# Patient Record
Sex: Female | Born: 1955 | Race: White | Hispanic: No | Smoking: Former smoker
Health system: Southern US, Community
[De-identification: ages and names within clinical notes are randomized; demographics above are authoritative.]

## PROBLEM LIST (undated history)

## (undated) DIAGNOSIS — E7800 Pure hypercholesterolemia, unspecified: Secondary | ICD-10-CM

## (undated) DIAGNOSIS — G4700 Insomnia, unspecified: Secondary | ICD-10-CM

## (undated) DIAGNOSIS — Z950 Presence of cardiac pacemaker: Secondary | ICD-10-CM

## (undated) DIAGNOSIS — K439 Ventral hernia without obstruction or gangrene: Secondary | ICD-10-CM

## (undated) DIAGNOSIS — R001 Bradycardia, unspecified: Secondary | ICD-10-CM

## (undated) DIAGNOSIS — D696 Thrombocytopenia, unspecified: Secondary | ICD-10-CM

## (undated) DIAGNOSIS — C20 Malignant neoplasm of rectum: Secondary | ICD-10-CM

## (undated) HISTORY — PX: ABDOMINAL HYSTERECTOMY: SHX81

## (undated) HISTORY — PX: CEREBRAL ANEURYSM REPAIR: SHX164

## (undated) HISTORY — PX: COLON SURGERY: SHX602

## (undated) HISTORY — PX: VENTRAL HERNIA REPAIR: SHX424

## (undated) HISTORY — DX: Ventral hernia without obstruction or gangrene: K43.9

## (undated) HISTORY — DX: Thrombocytopenia, unspecified: D69.6

## (undated) HISTORY — PX: CHOLECYSTECTOMY: SHX55

## (undated) HISTORY — DX: Insomnia, unspecified: G47.00

## (undated) HISTORY — DX: Malignant neoplasm of rectum: C20

---

## 1997-09-30 ENCOUNTER — Emergency Department (HOSPITAL_COMMUNITY): Admission: EM | Admit: 1997-09-30 | Discharge: 1997-09-30 | Payer: Self-pay | Admitting: Emergency Medicine

## 1998-01-18 ENCOUNTER — Encounter: Payer: Self-pay | Admitting: Emergency Medicine

## 1998-01-18 ENCOUNTER — Emergency Department (HOSPITAL_COMMUNITY): Admission: EM | Admit: 1998-01-18 | Discharge: 1998-01-18 | Payer: Self-pay | Admitting: Emergency Medicine

## 1999-04-06 ENCOUNTER — Inpatient Hospital Stay (HOSPITAL_COMMUNITY): Admission: EM | Admit: 1999-04-06 | Discharge: 1999-04-08 | Payer: Self-pay | Admitting: Emergency Medicine

## 1999-12-27 ENCOUNTER — Emergency Department (HOSPITAL_COMMUNITY): Admission: EM | Admit: 1999-12-27 | Discharge: 1999-12-27 | Payer: Self-pay | Admitting: Emergency Medicine

## 1999-12-28 ENCOUNTER — Encounter: Payer: Self-pay | Admitting: Emergency Medicine

## 2001-01-30 ENCOUNTER — Ambulatory Visit (HOSPITAL_COMMUNITY)
Admission: RE | Admit: 2001-01-30 | Discharge: 2001-01-30 | Payer: Self-pay | Admitting: Physical Medicine and Rehabilitation

## 2001-01-30 ENCOUNTER — Encounter: Payer: Self-pay | Admitting: Physical Medicine and Rehabilitation

## 2001-04-14 ENCOUNTER — Encounter: Payer: Self-pay | Admitting: Neurosurgery

## 2001-04-14 ENCOUNTER — Ambulatory Visit (HOSPITAL_COMMUNITY): Admission: RE | Admit: 2001-04-14 | Discharge: 2001-04-14 | Payer: Self-pay | Admitting: Neurosurgery

## 2001-04-24 ENCOUNTER — Ambulatory Visit (HOSPITAL_COMMUNITY): Admission: RE | Admit: 2001-04-24 | Discharge: 2001-04-24 | Payer: Self-pay | Admitting: Neurosurgery

## 2001-04-24 ENCOUNTER — Encounter: Payer: Self-pay | Admitting: Neurosurgery

## 2002-04-23 ENCOUNTER — Encounter: Payer: Self-pay | Admitting: Internal Medicine

## 2002-04-23 ENCOUNTER — Ambulatory Visit (HOSPITAL_COMMUNITY): Admission: RE | Admit: 2002-04-23 | Discharge: 2002-04-23 | Payer: Self-pay | Admitting: Internal Medicine

## 2002-11-15 ENCOUNTER — Emergency Department (HOSPITAL_COMMUNITY): Admission: EM | Admit: 2002-11-15 | Discharge: 2002-11-16 | Payer: Self-pay | Admitting: Emergency Medicine

## 2004-12-06 ENCOUNTER — Ambulatory Visit (HOSPITAL_COMMUNITY): Admission: RE | Admit: 2004-12-06 | Discharge: 2004-12-06 | Payer: Self-pay | Admitting: Specialist

## 2005-12-17 ENCOUNTER — Ambulatory Visit (HOSPITAL_COMMUNITY): Admission: RE | Admit: 2005-12-17 | Discharge: 2005-12-17 | Payer: Self-pay | Admitting: Family Medicine

## 2005-12-24 ENCOUNTER — Ambulatory Visit (HOSPITAL_COMMUNITY): Admission: RE | Admit: 2005-12-24 | Discharge: 2005-12-24 | Payer: Self-pay

## 2006-06-03 DIAGNOSIS — C20 Malignant neoplasm of rectum: Secondary | ICD-10-CM

## 2006-06-03 HISTORY — DX: Malignant neoplasm of rectum: C20

## 2006-12-24 ENCOUNTER — Ambulatory Visit (HOSPITAL_COMMUNITY): Admission: RE | Admit: 2006-12-24 | Discharge: 2006-12-24 | Payer: Self-pay | Admitting: Gastroenterology

## 2006-12-24 ENCOUNTER — Encounter: Payer: Self-pay | Admitting: Gastroenterology

## 2006-12-24 ENCOUNTER — Ambulatory Visit: Payer: Self-pay | Admitting: Gastroenterology

## 2006-12-24 HISTORY — PX: SIGMOIDOSCOPY: SUR1295

## 2006-12-25 ENCOUNTER — Ambulatory Visit (HOSPITAL_COMMUNITY): Admission: RE | Admit: 2006-12-25 | Discharge: 2006-12-25 | Payer: Self-pay | Admitting: Gastroenterology

## 2007-02-04 ENCOUNTER — Ambulatory Visit: Admission: RE | Admit: 2007-02-04 | Discharge: 2007-04-09 | Payer: Self-pay | Admitting: Radiation Oncology

## 2007-11-13 ENCOUNTER — Ambulatory Visit (HOSPITAL_COMMUNITY): Admission: RE | Admit: 2007-11-13 | Discharge: 2007-11-13 | Payer: Self-pay | Admitting: Gastroenterology

## 2007-11-13 ENCOUNTER — Ambulatory Visit: Payer: Self-pay | Admitting: Gastroenterology

## 2007-11-13 HISTORY — PX: COLONOSCOPY: SHX174

## 2008-09-07 ENCOUNTER — Ambulatory Visit: Payer: Self-pay | Admitting: Gastroenterology

## 2008-09-07 DIAGNOSIS — C19 Malignant neoplasm of rectosigmoid junction: Secondary | ICD-10-CM | POA: Insufficient documentation

## 2008-09-12 ENCOUNTER — Ambulatory Visit: Payer: Self-pay | Admitting: Gastroenterology

## 2008-09-12 HISTORY — PX: COLONOSCOPY: SHX174

## 2008-09-15 ENCOUNTER — Ambulatory Visit (HOSPITAL_COMMUNITY): Admission: RE | Admit: 2008-09-15 | Discharge: 2008-09-15 | Payer: Self-pay | Admitting: Gastroenterology

## 2008-12-28 ENCOUNTER — Encounter: Payer: Self-pay | Admitting: Gastroenterology

## 2009-08-18 ENCOUNTER — Encounter (INDEPENDENT_AMBULATORY_CARE_PROVIDER_SITE_OTHER): Payer: Self-pay | Admitting: *Deleted

## 2009-09-11 DIAGNOSIS — C20 Malignant neoplasm of rectum: Secondary | ICD-10-CM | POA: Insufficient documentation

## 2009-09-13 ENCOUNTER — Ambulatory Visit: Payer: Self-pay | Admitting: Gastroenterology

## 2009-11-15 ENCOUNTER — Encounter: Payer: Self-pay | Admitting: Gastroenterology

## 2010-05-30 ENCOUNTER — Encounter: Payer: Self-pay | Admitting: Gastroenterology

## 2010-07-05 NOTE — Letter (Signed)
Summary: [facility] CLINIC NOTE FROM DR [facility]  [facility] CLINIC NOTE FROM DR SHEN   Imported By: Rexene Alberts 05/30/2010 11:49:27  _____________________________________________________________________  External Attachment:    Type:   Image     Comment:   External Document

## 2010-07-05 NOTE — Letter (Signed)
Summary: External Other  External Other   Imported By: Peggyann Shoals 11/15/2009 14:06:59  _____________________________________________________________________  External Attachment:    Type:   Image     Comment:   External Document

## 2010-07-05 NOTE — Letter (Signed)
Summary: [facility]  [facility]  [address]   [place], [place] [address]   Phone: [phone]  Fax: [phone]    08/18/2009  Robin Wolfe [address] [place], [place]  [address] [birth year]  Dear [name],   Your physician has indicated that:   _______it is time to schedule an appointment.   _______you missed your appointment on______ and need to call and  reschedule.   _______you need to have lab work done.   _______you need to schedule an appointment to discuss lab or test results.   _______you need to call to reschedule your appointment that was scheduled on _________.   Please call our office at  [phone].    Thank you,    [facility] Ph: [phone]   Fax: [phone]

## 2010-07-05 NOTE — Assessment & Plan Note (Signed)
Summary: STAGE III RECTAL CA   Visit Type:  Follow-up Visit Primary Care Provider:  Sherwood Gambler, M.D.  Chief Complaint:  1 year follow up- doing ok.  History of Present Illness: No q/c/c. Energy level: good. Appetite: good. Sees Dr. Ubaldo Glassing q6 ZOX:WRUE/AVWUJ CA. Hernia fixed and next follwo Feb 2012. BMs: good-depends on how much she eats. Weight: fluctuates 128-132 lbs. Uses Ibuprofen for back pain and Xanax for sleep.  Current Medications (verified): 1)  Soma 350 Mg Tabs (Carisoprodol) .... Two Times A Day 2)  Alprazolam 0.5 Mg Tbdp (Alprazolam) .Marland Kitchen.. 1-2 Once Daily 3)  Ibuprofen 200 Mg Tabs (Ibuprofen) .... As Needed  Allergies (verified): 1)  ! Compazine 2)  ! 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 Deveron Furlong HYSTERECTOMY SECONDARY TO ENDOMETRIOSIS CHOLECYSTECOMY  Vital Signs:  Patient profile:   55 year old female Height:      64 inches Weight:      133 pounds BMI:     22.91 Temp:     98.2 degrees F oral Pulse rate:   64 / minute BP sitting:   128 / 90  (left arm) Cuff size:   regular  Vitals Entered By: Hendricks Limes LPN (September 13, 2009 3:31 PM)  Physical Exam  General:  Well developed, well nourished, no acute distress. Head:  Normocephalic and atraumatic. Lungs:  Clear throughout to auscultation. Heart:  Regular rate and rhythm; no murmurs. Abdomen:  Soft, nontender and nondistended. Normal bowel sounds.  Impression & Recommendations:  Problem # 1:  RECTAL CANCER (ICD-154.1) Assessment Unchanged TCS 2013. High fiber diet. OPV as needed.  CC: PCP  Other Orders: Est. Patient Level II (81191)  Appended Document: STAGE III RECTAL CA reminder appt made for TCS in 2013- cdg

## 2010-07-31 ENCOUNTER — Encounter: Payer: Self-pay | Admitting: Internal Medicine

## 2010-09-12 LAB — CBC
MCHC: 35.2 g/dL (ref 30.0–36.0)
Platelets: 101 10*3/uL — ABNORMAL LOW (ref 150–400)
RBC: 3.59 MIL/uL — ABNORMAL LOW (ref 3.87–5.11)
RDW: 14.4 % (ref 11.5–15.5)

## 2010-09-12 LAB — BASIC METABOLIC PANEL
BUN: 12 mg/dL (ref 6–23)
CO2: 33 mEq/L — ABNORMAL HIGH (ref 19–32)
Calcium: 9.3 mg/dL (ref 8.4–10.5)
Creatinine, Ser: 0.71 mg/dL (ref 0.4–1.2)
GFR calc Af Amer: >60 mL/min (ref >60)
Glucose, Bld: 96 mg/dL (ref 70–99)

## 2010-10-16 NOTE — Op Note (Signed)
[name], [name]                 ACCOUNT NO.:  [id number]   MEDICAL RECORD NO.:  [id number]          PATIENT TYPE:  AMB   LOCATION:  DAY                           FACILITY:  [facility]   PHYSICIAN:  Kassie Mends, M.D.      DATE OF BIRTH:  10/07/1955   DATE OF PROCEDURE:  DATE OF DISCHARGE:                               OPERATIVE REPORT   PRIMARY CARE PHYSICIAN:  Elfredia Nevins, MD.   REFERRING PHYSICIAN:  Dr. Flonnie Hailstone, [facility].   PROCEDURE:  Colonoscopy.   INDICATION FOR EXAM:  Robin Wolfe is a 55 year old female who presented in  July of 2008 with rectal bleeding.  She had an incomplete colonoscopy  due to a mass approximately 5 cm from the anal verge that was partially  obstructing the lumen.  She subsequently had a resection with a  diverting ileostomy.  She presents today for evaluation to clear her  colon and has a pending appointment with Robin Wolfe on November 19, 2007 to  discuss takedown of her ostomy and secondary anastomosis.   FINDINGS:  1. The anastomosis was located approximately 5 cm from the anal verge.      The adult colonoscope would not pass through the anastomotic site.      No polyps or lesions were seen at the anastomotic site.  Sutures      were see seen.  The adult colonoscope was exchanged for the      diagnostic gastroscope, which passed through the anastomosis with      moderate resistance.  The diagnostic gastroscope was passed to the      proximal portion of the colon.  A moderate amount of stool was seen      in the proximal colon and was unable to appreciate an ileocecal      valve or an appendiceal orifice.  The op note from her surgery at      [facility] was not available to me.  No polyps, masses,      diverticula or AVMs were seen.  With distention of the colon the      mucosa became somewhat hemorrhagic but no tears were seen and no      active bleeding was noted.  She had a normal retroflexed view of      the rectum.   DIAGNOSIS:  1.  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  2. Normal visualized colon with stool seen in the proximal colon.   RECOMMENDATIONS:  1. Recommend Robin Wolfe see Dr. Flonnie Hailstone and decide whether or not they      would like to repeat a colonoscopy with dilation of the anastomosis      so that a pediatric or an adult colonoscope could be advanced into      the proximal colon.  She was given pictures to take with to her to      visit with Robin Wolfe.  I will speak  with Robin Wolfe in regard to      these findings.  Her next endoscopy should be done with propofol      because she required high doses of Demerol, Versed and Phenergan to      be adequately sedated.   MEDICATIONS:  1. Demerol 150 mg IV.  2. Versed 10 mg IV.  3. Phenergan 25 mg IV.   PROCEDURE TECHNIQUE:  Physical exam was performed.  Informed consent was  obtained from the patient after explaining the benefits, risks and  alternatives to the procedure.  The patient was connected to the monitor  and placed in the left lateral position.  Continuous oxygen was provided  by nasal cannula, IV medicine administered through an indwelling  cannula.  After administration of sedation and rectal exam, the  patient's rectum was intubated and the adult colonoscope was advanced to  the anastomosis.  Due to the narrowing of the lumen, the adult  colonoscope was exchanged for a diagnostic gastroscope.  The diagnostic  gastroscope was passed into the proximal colon.  The scope was withdrawn  slowly by carefully examining the color, texture, anatomy and integrity  of the mucosa on the way out.  The patient was recovered in endoscopy  and discharged home in satisfactory condition.   PATH:  Spoke with Dr. Flonnie Hailstone. Recommended repeat colonoscopy at [facility]  with dilation of the anastamosis and confirmation of complete  colonoscopy  followed by ostomy takedown.      Kassie Mends, M.D.  Electronically Signed     SM/MEDQ  D:  11/13/2007  T:  11/13/2007  Job:  [id number]   cc:   Robin Rear. Sherwood Gambler, MD  Fax: [phone]   Sherrilyn Rist, MD  [facility]

## 2010-10-16 NOTE — Op Note (Signed)
[name], [name]                 ACCOUNT NO.:  [id number]   MEDICAL RECORD NO.:  [id number]         PATIENT TYPE:  PAMB   LOCATION:  DAY                           FACILITY:  [facility]   PHYSICIAN:  Robin Wolfe, M.D.      DATE OF BIRTH:  1955-10-10   DATE OF PROCEDURE:  09/12/2008  DATE OF DISCHARGE:                               OPERATIVE REPORT   REFERRING Robin Wolfe:  Robin Rear. Sherwood Gambler, MD   PROCEDURE:  Colonoscopy.   INDICATION FOR EXAM:  Robin Wolfe is a 55 year old female who was  diagnosed with colon cancer in 2008.  She had a subsequent resection and  chemoradiation.  She initially had an ileostomy and then a take down.  She has not had a complete colonoscopy since being diagnosed.   FINDINGS:  1. Probable anastomosis.  Otherwise, no mass or polyps seen.  No      inflammatory changes, polyps, or AVMs seen throughout the colon.      No diverticula.  2. Small rectal pouch, which made retroflexion challenging.  Otherwise      no polyps detected in the rectum.   DIAGNOSIS:  Friable anastomosis.   RECOMMENDATIONS:  1. Screening colonoscopy in 3 years.  2. She should follow up with Dr. Emelda Wolfe in regards to her complaint      for vaginal stenosis.  She may also consult with her radiation      oncologist.  3. She was noted to have pancytopenia.  She should follow up with her      oncologist in regards to a repeat CBC.  4. She should follow a high-fiber diet.  She was given a handout on      high-fiber diet.  All first-degree relatives should have a      screening colonoscopy at age 65 and then every 5 years.   MEDICATIONS:  Propofol provided by An[facility].   PROCEDURE TECHNIQUE:  Physical exam was performed.  Informed consent was  obtained from the patient after explaining the benefits, risks, and  alternatives to the procedure.  The patient was connected to the monitor  and placed in a left lateral position.  Continuous oxygen was provided  by nasal cannula and IV medicine  administered through an indwelling  cannula.  After administration of sedation and rectal exam, the  patient's rectum was intubated and the scope was advanced under direct  visualization to the distal terminal ileum.  The scope was removed  slowly  by carefully examining the color, texture, anatomy, and integrity of the  mucosa on the way out.  Retroflexed view was challenging in the small  rectal pouch.  The patient was recovered in endoscopy and discharged  home in satisfactory condition.      Robin Wolfe, M.D.  Electronically Signed     SM/MEDQ  D:  09/15/2008  T:  09/16/2008  Job:  [id number]   cc:   Robin Rear. Sherwood Gambler, MD  Fax: [phone]

## 2010-10-16 NOTE — Op Note (Signed)
Robin Wolfe, Robin Wolfe                 ACCOUNT NO.:  [id number]   MEDICAL RECORD NO.:  [id number]          PATIENT TYPE:  AMB   LOCATION:  DAY                           FACILITY:  [facility]   PHYSICIAN:  Kassie Mends, M.D.      DATE OF BIRTH:  [birth year]   DATE OF PROCEDURE:  12/24/2006  DATE OF DISCHARGE:                               OPERATIVE REPORT   PROCEDURE:  Sigmoidoscopy with cold forceps biopsy.   INDICATION FOR EXAM:  Robin Wolfe is a 55 year old patient who was  referred for screening colonoscopy.  She reports having rectal bleeding  and change in bowel habits in February 2008.   FINDINGS:  1. Unable to pass the adult colonoscope beyond a rectal mass located      approximately 5 cm from anal verge.  The adult colonoscope was      exchanged for the diagnostic gastroscope and passed approximately      40 cm beyond anal verge.  A large ulcerated mass extends from 5 cm      beyond anal verge to 12 cm beyond anal verge.  Biopsies were      obtained via cold forceps.  2. Normal retroflexed view of the rectum.   RECOMMENDATIONS:  1. Low residue diet.  She is given handout on low residue diet.  2. She will be scheduled for a chest x-ray, PA and lateral on CT scan      the abdomen and pelvis with and without IV contrast and oral      contrast on 12/25/06.  3. Will draw CEA, PT/PTT, CBC and complete metabolic panel today.  4. She should have colonoscopy in 6 months to a year to complete      evaluation of her colon.  5. No aspirin or anti-inflammatory drugs or anticoagulation.  6. The patient is scheduled for surgical evaluation with Dr.  Deveron Furlong on January 06, 2007, 8:45 a.m.  She is given pictures from the      sigmoidoscopy on today.  The pathology slides also are being sent      to Dr. Deveron Furlong after they had been read.  His contact phone      number is [phone], and fax number is [phone].   MEDICATIONS:  1. Demerol 75 mg IV.  2. Versed 4 mg IV.   PROCEDURE  TECHNIQUE:  Physical exam was performed.  Informed consent was  obtained from the patient after explaining benefits, risks and  alternatives to the procedure.  The patient connected to monitor and  placed in the left lateral position.  Continuous oxygen was provided by  nasal cannula and IV medicine administered through an indwelling  cannula.  After administration of sedation and rectal exam the scope was  advanced under direct visualization to 40 cm beyond anal verge.  The  scope was removed slowly by carefully examining the color, texture,  anatomy and integrity mucosa on the way out.  The patient was recovered  in endoscopy and discharged home in satisfactory condition.   ADDENDUM: Called by Dr. Sherryll Burger.  Patient has rectal adenocarcinoma.      Kassie Mends, M.D.  Electronically Signed     SM/MEDQ  D:  12/24/2006  T:  12/24/2006  Job:  [id number]   cc:   Madelin Rear. Sherwood Gambler, MD  Fax: [phone]

## 2010-10-19 NOTE — Procedures (Signed)
PURPOSE OF EVALUATION:  Independent medical evaluation requested by [employer].   CLAIM NUMBER:  Social security number [id number]   DATE OF BIRTH:  03/23/1956.   OCCUPATION:  LPN   DATE OF DISABILITY:  January 16, 2001.   HISTORY OF PRESENT ILLNESS:  Ms. Rattan is a 55 year old adult female  requested to be seen in this office for an independent medical evaluation by  [facility].   Her medical records were reviewed prior to the patient's appointment and  then those were again reviewed with her in the office today.  Summary of the  history is presented  chronologically as noted below.   The patient reports low back pain dating to at least 52.  She reports that  was treated by a chiropractor, Dr. Raynelle Bring.   Medical records indicate that December 06, 1996 an MRI scan of her lumbar spine  showed no significant abnormality.   December 10, 1996 the patient was seen by Dr. Lovell Sheehan a local neurosurgeon.  The  diagnosis was lumbago with occasional radiculopathy.  He noted studies were  normal.  No surgery was planned.  There was a suggestion of possible nerve  conduction studies to be completed.  There was also a referral to a pain  clinic suggested and Dr. Lovell Sheehan recommend a trial of Elavil.   It is unclear what medical treatment the patient received between 1998 and  the year 2002.  Medical records resume August 01, 2000 when she had a CT of  her lumbar spine which showed minimal facet degenerative changes at L5-S1,  and multi-level minimal bulging disks.   August 15, 2000 the patient was seen by Dr. Tresa Endo an orthopedic physician.  She was complaining of low back pain at that time.  He noted the CAT scan of  her lumbar spine showing minimal facet degenerative changes and multi-level  minimal bulging disks.  His diagnosis was sciatic type pain on the left.  He  recommended a trial of physical therapy along with Bextra and Tylenol #3.   September 09, 2000 the patient saw Dr.  Tresa Endo in follow up and was responding well  to physical therapy at that time.  Dr. Tresa Endo injected bilateral hips for  trochanteric bursitis at that point.   Oct 21, 2000 the patient again saw Dr. Tresa Endo in follow up.  He again  injected but this time was only her left hip.  She reports that helped for  approximately 1 month afterward.  She was started on Relafen at that time.   November 17, 2000 her hip pain was apparently better but she was told to  continue Relafen.   December 23, 2000 the patient saw Dr. Ethelene Hal a local physiatrist.  Dr. Ethelene Hal  noted the patient had been seen in their office more than 3 years ago,  specifically by Dr. Rennis Chris for left rotator cuff tendonitis.  Again, Dr.  Ethelene Hal was evaluating her for her back pain.  He noted the left hip films  were negative.  He asked for a CAT scan of her lumbosacral spine and  suggested epidural steroid injections.   December 24, 2000 the patient underwent lumbar epidural steroid injections at L4-  5, towards the left.  There was also a left L4-5 and L5-S12 facet injection  done by Dr. Ethelene Hal.  The patient reports that she gained only a few days of  relief with these injections.   January 13, 2001 the patient followed up with Dr. Ethelene Hal  and was reportedly  doing well.  She was told to continue her home exercise program with no  further injections recommended.  She was reportedly 75 to 80% better for  approximately 1 week after the injections noted above.  Follow up was on an  as needed basis.   January 16, 2001 was the date of her disability according to the records.   January 19, 2001 the patient saw Dr. Ethelene Hal in follow up and was prescribed  Ambien for sleep along with Lortab.  She had pain in her low back along with  posterior thigh pain, sometimes below the left knee.   January 30, 2001 a CAT scan of her lumbosacral spine showed mild diffuse  broad based annular disk bulge at L4-5.  At L5-S1 there was central broad  based annular disk  bulging.   February 21, 2001 the patient saw Dr. Ethelene Hal in follow up.  She had  persistent pain and paper work was completed for family medical leave  absence.  She was referred to neurosurgery at that time.   February 24, 2001 the patient was seen by Dr. Noel Gerold an orthopedist  specializing in back care through Dr. Ethelene Hal' office.  At that time Dr. Noel Gerold  reportedly reviewed the CAT scan.  His diagnosis was lumbar spondylosis and  back pain.  He recommended continuation of physical therapy and follow up  with Dr. Ethelene Hal.   March 30, 2001 the patient saw Dr. Mikal Plane a local neurosurgeon.  No  surgery was recommended.  An MRI scan was requested.   April 14, 2001 lumbosacral plain films showed mild spondylosis at L5-S1  level.  On the same date an MRI scan of her lumbar spine showed small left  central herniated  nucleus pulposus at L1-2 and degenerative disk disease  and bulging at L5-S1.   July 15, 2001 the patient was evaluated by Dr. Gerlene Fee, a third  neurosurgeon to evaluate her.  At that time the MRI scan was reviewed and  was unremarkable with no evidence of disk herniation or neural  compression.  Her strength was noted to be 5/5 and sensation was intact  with reflexes 1 to 2+.  No surgery was recommended and there was a referral  made for physiatry.   Oct 07, 2003 the patient was evaluated by Dr. Madelin Rear. Fusco her primary  care physician.  At that time he felt the patient was suffering from  sciatica and arthritis and felt that those conditions kept Mrs. Hook from  any active or possible sedentary work.  The patient reports that Dr. Sherwood Gambler has been treating her back pain with  Vicodin along with Valium, Flexeril, Xanax, and Trazodone.   Presently the patient reports that her pain is located in her mid lumbar  region with radiation into her buttocks and into the back of the legs and down to the feet on an occasional basis.  She reports that her fourth and  fifth  toes are occasionally numb.  She reports that she takes Flexeril and  Vicodin and those help most with her pain.  She complains of a knot in her  low back with prolonged standing.  She reports that she has not worked since  January 16, 2001 and does a little bit of chores around the home including  washing clothes and limited cooking.  She reports that she stretches each  morning and applies heat occasionally.  She does reportedly do some other  exercises.  She denies any bowel  or bladder incontinence.  She reports that she takes  approximately  Vicodin x3 per day and reports increased pain with increased  activity.  She does take her Flexeril x3 daily and generally uses her Valium  at night for tightness in her low back.   The patient reports that she has tried Neurontin, Relafen, Naprosyn and  Vicodin along with OxyContin, Tylox and Soma.  Some of the medications she  is fearful of and other did not give her much relief.   PAST MEDICAL HISTORY:  1. History of Guillain Barre syndrome.  2. Prior cholecystectomy.  3. History of cerebral aneurysm, status post clipping.  4. History of left rotator cuff tendonitis treated by Dr. Rennis Chris.  5. History of depression with adjustment disorder and mixed emotional     features treated with Xanax.  6. History of left trochanteric bursitis.  7. History of right Lisfranc mid foot fracture and first right cuneiform     fracture along with third metatarsal fracture of the right foot.   MEDICATIONS:  1. Vicodin ES 1-2 tablets q.4h prn (3 per day).  2. Valium 10 mg one tablet q.h.s.  3. Trazodone 50 mg one tablet q.h.s..  4. Xanax 0.25 mg 1 tablet t.i.d. p.r.n.  5. Flexeril 10 mg 1 tablet t.i.d. p.r.n.   FAMILY HISTORY:  Her father is deceased from strokes and her mother is alive  with hypercholesterolemia.  She has a history of heart disease on her father's side along with  hypercholesterolemia throughout.   ALLERGIES:  COMPAZINE.   SOCIAL  HISTORY:  The patient previously worked as an Public house manager.  She reports the  date of disability as January 16, 2001.  She has a high school education and  is married with 2 children.  She does not use alcohol or tobacco.   PHYSICAL EXAMINATION:  GENERAL:  Well-appearing, fit, pleasant and  cooperative adult female dressed casually in the office today.  VITAL SIGNS:  Blood pressure was 124/81 with a pulse of 79, respiratory rate  18 and O2 saturation 99% on room air.   The patient was able to toe-walk and heel-walk without difficulty.  She did  not use any device for ambulation.  Upper extremity range of motion was full  and pain free.  Lumbar range of motion was decreased in all planes with  flexion to 60 degrees only.  Upper extremity exam showed normal bulk and  tone with normal reflexes.  Strength was 5/5 in bilateral upper extremities  in terms of shoulder flexion and abduction, biceps, triceps, and grip.  Bilateral lower extremity exam showed normal bulk and tone throughout.  Hip  flexion, knee extension and ankle dorsiflexion were 4-/5 bilaterally with  complaints of pain.  Sensation was intact to light touch throughout the  bilateral lower extremities.  Reflexes were 1+ at bilateral knees and  ankles.   In the supine position, straight leg raise was negative bilaterally with  complaints of tightness of her hips.  She did also complain of low back pain  with range of motion of her hips bilaterally.   On palpation there were minimal to no spasms noted of her lumbar region  bilaterally.   IMPRESSION:  Mild/moderate lumbar spondylosis.   At the present time the patient's medical history and diagnostic studies are  consistent with the diagnosis of mild/moderate lumbar spondylosis.  She has  been evaluated by three neurosurgeons along with various other physicians.  The three neurosurgeons have all been consistent in their  recommendation not  to proceed with any surgery for her condition.   She is being treated  conservatively at this point by her primary care physician.   I will answer questions regarding this patient as they have been provided to  me by [facility].  I did spend 40 to 45 minutes with the patient and  also reviewed medical information for approximately 60 minutes prior to this  appointment, and then spent 30 minutes on the dictation.   The first question is to list the diagnosis and objective findings to  support each.  The objective findings are the MRI studies and CAT scans that  have been noted above.  Her physical exam is rather unremarkable with only  mild weakness secondary to pain noted in her bilateral lower extremities.  There is not any indication of significant neurological compromise on  physical exam.   The next question is in regard to specific job duties and to explain what  the patient was capable or incapable of performing.  At this point I feel  the patient is capable of performing sedentary work with a lifting limit of  approximately 20 pounds.  I feel that she could safely do sedentary type  work within this lifting restriction and not cause further damage.  She  would need periodic rest breaks and a change of position, i.e.,  approximately 10 minutes every 60 to 90 minutes.   The next question is in regard to restrictions and limitations and I think  that has been answered in the statement above.  I do not feel that any  further laboratory or x-rays or diagnostic tests are indicated for this  patient.  I furthermore do not feel that a function capacity evaluation  would give [place] any further information as I do feel that she is capable of  sedentary work but probably nothing beyond sedentary work, i.e., lifting of  20 pounds maximum.  There is no other therapy or surgery which I feel is  appropriate for this individual.  She falls into the broad class of  degenerative disk disease which she is being treated conservatively with  a combination of various medicines as noted above.   There are no statement that I need to make at this point in terms of  physical behavior that were either expected or unusual or inconsistent with  the condition noted above.   The next and last question appears to be if there is any impairment in this  individual and whether incomplete rehabilitation or work hardening may be  appropriate.  She has  minimal impairment at the present time in my opinion  and that is probably a life long but with the very strong chance of not  worsening to any significant degree if she stays healthy and follows general  lifting and body mechanic restrictions.   I have reviewed all information, records and data provided to me by [facility].  I have responded to the questions presented above based on my  education, training and experience.    _______________________________  Rande Brunt. Thomasena Edis, M.D.  (Board Certified in rehabilitation medicine)                                                             Ellwood Dense, M.D.  DC/MedQ  D:  12/04/2003 20:18:18  T:  12/05/2003 08:22:44  Job #:  [id number]   cc:   c/o Lupita Leash Nipper Independent assessment coordinator [employer]  [address] [facility]  [id number]  [place], [place].  [id number]

## 2010-12-26 ENCOUNTER — Emergency Department (HOSPITAL_COMMUNITY): Payer: BC Managed Care – PPO

## 2010-12-26 ENCOUNTER — Encounter: Payer: Self-pay | Admitting: Emergency Medicine

## 2010-12-26 ENCOUNTER — Emergency Department (HOSPITAL_COMMUNITY)
Admission: EM | Admit: 2010-12-26 | Discharge: 2010-12-26 | Disposition: A | Discharge status: Home / Self Care | Payer: BC Managed Care – PPO | Attending: Emergency Medicine | Admitting: Emergency Medicine

## 2010-12-26 DIAGNOSIS — S52123A Displaced fracture of head of unspecified radius, initial encounter for closed fracture: Secondary | ICD-10-CM | POA: Insufficient documentation

## 2010-12-26 DIAGNOSIS — W19XXXA Unspecified fall, initial encounter: Secondary | ICD-10-CM | POA: Insufficient documentation

## 2010-12-26 DIAGNOSIS — Z85038 Personal history of other malignant neoplasm of large intestine: Secondary | ICD-10-CM | POA: Insufficient documentation

## 2010-12-26 DIAGNOSIS — Z87891 Personal history of nicotine dependence: Secondary | ICD-10-CM | POA: Insufficient documentation

## 2010-12-26 DIAGNOSIS — S52122A Displaced fracture of head of left radius, initial encounter for closed fracture: Secondary | ICD-10-CM

## 2010-12-26 HISTORY — DX: Pure hypercholesterolemia, unspecified: E78.00

## 2010-12-26 MED ORDER — IBUPROFEN 600 MG PO TABS: 600.0000 mg | ORAL_TABLET | Freq: Four times a day (QID) | ORAL | Status: AC | PRN

## 2010-12-26 MED ORDER — IBUPROFEN 400 MG PO TABS
600.0000 mg | ORAL_TABLET | Freq: Once | ORAL | Status: AC
  Administered 2010-12-26: 600 mg via ORAL
  Filled 2010-12-26: qty 2

## 2010-12-26 NOTE — ED Provider Notes (Addendum)
History   Written by Enos Fling acting as scribe for Lyanne Co, MD.   Chief Complaint  Patient presents with  . Elbow Pain   HPI Comments: Pt reports twisting her ankle yesterday while walking, causing her to fall forward, landing on oustretched hands and rolling to left. Pt now c/o left elbow pain, constant since fall. No numbness or tingling. No wrist, knee, or ankle pain. No head injury or LOC. No other complaints.   Patient is a 55 y.o. female presenting with fall. The history is provided by the patient.  Fall The accident occurred yesterday. The fall occurred while walking. Impact surface: asphalt. There was no blood loss. Point of impact: hands. The pain is present in the left elbow. The pain is mild. She was ambulatory at the scene. Pertinent negatives include no fever, no numbness, no abdominal pain, no nausea, no vomiting, no headaches, no loss of consciousness and no tingling. The symptoms are aggravated by use of the injured limb. She has tried NSAIDs for the symptoms. The treatment provided mild relief.    Past Medical History  Diagnosis Date  . Hypercholesteremia   . Colon cancer     Past Surgical History  Procedure Date  . Cerebral aneurysm repair   . Colon surgery   . Cholecystectomy   . Abdominal hysterectomy     History reviewed. No pertinent family history.  History  Substance Use Topics  . Smoking status: Former Games developer  . Smokeless tobacco: Not on file  . Alcohol Use: No    OB History    Grav Para Term Preterm Abortions TAB SAB Ect Mult Living                  Review of Systems  Constitutional: Negative for fever.  Gastrointestinal: Negative for nausea, vomiting and abdominal pain.  Neurological: Negative for tingling, loss of consciousness, numbness and headaches.  All other systems reviewed and are negative.    Physical Exam  BP 133/71  Pulse 56  Temp 97.9 F (36.6 C)  SpO2 100%  Physical Exam  Constitutional: She is oriented  to person, place, and time. She appears well-developed and well-nourished. No distress.  HENT:  Head: Normocephalic and atraumatic.  Eyes: Conjunctivae are normal.  Neck: Normal range of motion. Neck supple.  Musculoskeletal: Normal range of motion.       No tenderness at distal radius, normal radial pulse. Tenderness at radial head, pain with pronation and supination.  Neurological: She is alert and oriented to person, place, and time.  Skin: Skin is warm and dry. No rash noted.  Psychiatric: She has a normal mood and affect.    ED Course  Procedures  Dg Elbow Complete Left  12/26/2010  *RADIOLOGY REPORT*  Clinical Data: Pain secondary to a fall.  LEFT ELBOW - COMPLETE 3+ VIEW  Comparison: None.  Findings: There is a prominent joint effusion indicating probable occult fracture, probably of the radial head.  However, there is no visible fracture.  IMPRESSION: Probable occult fracture of the elbow, most likely the radial head.  Original Report Authenticated By: Gwynn Burly, M.D.     MDM  I personally reviewed the xray. Home with splint and sling and orthopedic followup     Lyanne Co, MD 12/26/10 0815  I personally performed the services described in this documentation, which was scribed in my presence. The recorded information has been reviewed and considered. Lyanne Co, MD    Lyanne Co, MD  12/26/10 0816 

## 2010-12-26 NOTE — ED Notes (Signed)
Pt fell in parking lot  Yesterday and fell onto L elbow. No obvious swelling or deformity. Radial pulse strong. Pt guarding area. Denies hitting head or LOC. nad

## 2010-12-26 NOTE — ED Notes (Signed)
Pt a/ox4. Resp even and unlabored. Pt stable at this time. D/C instructions reviewed. Pt verbalized understanding. Pt escorted to d/c desk. Pt ambulated with steady gate.

## 2010-12-26 NOTE — ED Notes (Signed)
Posterior long arm splint applied to left arm. Positive pulse and cap refill present. Pt able to wiggle fingers. Pt denies numbness. Sling applied over splint.

## 2010-12-31 ENCOUNTER — Encounter: Payer: Self-pay | Admitting: Orthopedic Surgery

## 2010-12-31 ENCOUNTER — Ambulatory Visit (INDEPENDENT_AMBULATORY_CARE_PROVIDER_SITE_OTHER): Payer: BC Managed Care – PPO | Admitting: Orthopedic Surgery

## 2010-12-31 VITALS — Resp 18 | Ht 64.0 in | Wt 148.0 lb

## 2010-12-31 DIAGNOSIS — M25529 Pain in unspecified elbow: Secondary | ICD-10-CM

## 2010-12-31 DIAGNOSIS — S52123A Displaced fracture of head of unspecified radius, initial encounter for closed fracture: Secondary | ICD-10-CM

## 2010-12-31 DIAGNOSIS — M25522 Pain in left elbow: Secondary | ICD-10-CM

## 2010-12-31 NOTE — Progress Notes (Signed)
Chief complaint is pain LEFT elbow  55 year old female fell and injured her LEFT elbow went to the hospital x-rays were inconclusive except for a large joint effusion  She was placed in a splint.  Loletha Grayer was 25 July.  She complains of moderate throbbing pain over the LEFT elbow relieved by ibuprofen worse with moving her arm no numbness good coloration to the hand  Review of systems notable for anxiety and seasonal ALLERGY  History reviewed as noted above  Vital signs are stable as recorded  General appearance is normal  The patient is alert and oriented x3  The patient's mood and affect are normal  Gait assessment: Gait assessment is normal. The cardiovascular exam reveals normal pulses and temperature without edema swelling.  The lymphatic system is negative for palpable lymph nodes  The sensory exam is normal.  There are no pathologic reflexes.  Balance is normal.   Exam of the LEFT elbow  Inspection the bony prominences around the elbow are non tender  except the radial head; Range of motion There is abnormal  flexion-extension and pronation supination of the LEFT elbow. Stability Is normal Strength Is normal Skin Is normal

## 2010-12-31 NOTE — Patient Instructions (Signed)
Wear sling for 1 week  Take sling off 3 x day so elbow will not get stiff  Take Ibuprofen 800mg  as needed up to 3 times per day.  Come back in 4 weeks for a recheck on ROM

## 2011-01-29 ENCOUNTER — Ambulatory Visit: Payer: BC Managed Care – PPO | Admitting: Orthopedic Surgery

## 2011-03-18 LAB — CEA: CEA: 1.4

## 2011-03-18 LAB — COMPREHENSIVE METABOLIC PANEL
AST: 21
Albumin: 3.6
Alkaline Phosphatase: 97
Chloride: 102
GFR calc Af Amer: >60
Potassium: 3.3 — ABNORMAL LOW
Sodium: 139
Total Bilirubin: 0.4
Total Protein: 6.8

## 2011-03-18 LAB — CBC
RBC: 4.44
WBC: 6.9

## 2011-11-01 ENCOUNTER — Encounter: Payer: Self-pay | Admitting: Gastroenterology

## 2011-11-04 ENCOUNTER — Encounter (HOSPITAL_COMMUNITY): Payer: Self-pay | Admitting: *Deleted

## 2011-11-04 ENCOUNTER — Emergency Department (HOSPITAL_COMMUNITY)
Admission: EM | Admit: 2011-11-04 | Discharge: 2011-11-04 | Disposition: A | Discharge status: Home / Self Care | Payer: BC Managed Care – PPO | Attending: Emergency Medicine | Admitting: Emergency Medicine

## 2011-11-04 DIAGNOSIS — H5702 Anisocoria: Secondary | ICD-10-CM | POA: Insufficient documentation

## 2011-11-04 DIAGNOSIS — E7800 Pure hypercholesterolemia, unspecified: Secondary | ICD-10-CM | POA: Insufficient documentation

## 2011-11-04 DIAGNOSIS — Z85038 Personal history of other malignant neoplasm of large intestine: Secondary | ICD-10-CM | POA: Insufficient documentation

## 2011-11-04 DIAGNOSIS — Z87891 Personal history of nicotine dependence: Secondary | ICD-10-CM | POA: Insufficient documentation

## 2011-11-04 NOTE — ED Notes (Addendum)
Rt pupil larger than lt, onset this am. Noticed difference in vision also.    No injury known  Alert, talking NAD

## 2011-11-04 NOTE — ED Provider Notes (Signed)
History   This chart was scribed for Cheri Guppy, MD by Clarita Crane. The patient was seen in room APA05/APA05. Patient's care was started at 1045.    CSN: [id number]  Arrival date & time 11/04/11  1045   First MD Initiated Contact with Patient 11/04/11 1214      Chief Complaint  Patient presents with  . Eye Problem    (Consider location/radiation/quality/duration/timing/severity/associated sxs/prior treatment) HPI Robin Wolfe is a 56 y.o. female who presents to the Emergency Department complaining of  blurred vision to right eye . Patient reports that when she first began experiencing blurred vision she observed that the pupil of her left eye was larger than that of her right. Denies HA, nausea, vomiting, dizziness, head injury, trauma. Patient notes that she wears make up daily. Patient with h/o hypercholesterolemia, colon CA, cerebral aneurysm repair, cholecystectomy, abdominal hysterectomy.   Past Medical History  Diagnosis Date  . Hypercholesteremia   . Colon cancer   . Insomnia     Past Surgical History  Procedure Date  . Cerebral aneurysm repair   . Colon surgery   . Cholecystectomy   . Abdominal hysterectomy     Family History  Problem Relation Age of Onset  . Heart disease    . Lung disease    . Diabetes      History  Substance Use Topics  . Smoking status: Former Games developer  . Smokeless tobacco: Not on file  . Alcohol Use: Yes     rarely    OB History    Grav Para Term Preterm Abortions TAB SAB Ect Mult Living                  Review of Systems A complete 10 system review of systems was obtained and all systems are negative except as noted in the HPI and PMH.   Allergies  Compazine and Hydromorphone hcl  Home Medications   Current Outpatient Rx  Name Route Sig Dispense Refill  . ALPRAZOLAM 0.5 MG PO TABS Oral Take 0.5 mg by mouth 2 (two) times daily as needed. For anxiety    . CARISOPRODOL 350 MG PO TABS Oral Take 350 mg by mouth 3  (three) times daily as needed. For muscle relaxer    . FLAX SEED OIL 1300 MG PO CAPS Oral Take 1 capsule by mouth every morning.    . IBUPROFEN 200 MG PO TABS Oral Take 800 mg by mouth every 6 (six) hours as needed. For pain    . PROPYLENE GLYCOL 0.6 % OP SOLN Ophthalmic Apply 2 drops to eye 3 (three) times daily as needed. For dry eyes    . SOLIFENACIN SUCCINATE 5 MG PO TABS Oral Take 10 mg by mouth every morning.    . TRAZODONE HCL 100 MG PO TABS Oral Take 100 mg by mouth at bedtime.      BP 177/78  Pulse 60  Temp(Src) 97.7 F (36.5 C) (Oral)  Resp 20  Ht 5\' 4"  (1.626 m)  Wt 140 lb (63.504 kg)  BMI 24.03 kg/m2  SpO2 100%  Physical Exam  Nursing note and vitals reviewed. Constitutional: She is oriented to person, place, and time. She appears well-developed and well-nourished. No distress.  HENT:  Head: Normocephalic and atraumatic.  Eyes: EOM are normal. Right eye exhibits no discharge. Left eye exhibits no discharge. No scleral icterus.       Pupils appropriately responsive to light with normal constriction. Fundi normal. No visual field defects noted.  Patient able to read with both eyes.   Neck: Neck supple. No tracheal deviation present.  Cardiovascular: Normal rate.   Pulmonary/Chest: Effort normal. No respiratory distress.  Abdominal: Soft. She exhibits no distension.  Musculoskeletal: Normal range of motion. She exhibits no edema.  Neurological: She is alert and oriented to person, place, and time. No sensory deficit.  Skin: Skin is warm and dry.  Psychiatric: She has a normal mood and affect. Her behavior is normal.    ED Course  Procedures (including critical care time) 16 y female with hx of cerbral aneurysm that was clipped several years ago presents to the ed with c/o irregular pupils. Says left pupil is larger than right.  No ha, dizziness, weakness.  PE a & o X3.  Hrt, lungs nl.  Facial expression normal.  Speech  Normal.  Left pupil 5 mm and reactive to direct and  consensual light.  Right pupil 3 mm and reactive to consensual and direct light.   Fundi normal bilat.  Va.  Pt can read text at 12 inches without error from left eye.  C/o that vision from right eye is more blurry than left.   With glasses on and using both eyes, she sees at baseline.   DIAGNOSTIC STUDIES: Oxygen Saturation is 100% on room air, normal by my interpretation.    COORDINATION OF CARE: Patient informed of current plan for treatment and evaluation and agrees with plan at this time.     Labs Reviewed - No data to display No results found.   No diagnosis found.  I spoke with Dr. Roseanne Reno the neurologist.  He said there is no indication for a ct or mri  I explained this to the pt, including the conversation with the neurologist.  She said she wanted a CT.  I told her I would not order it because it was not indicated even according to the neurologist. Angrily, she stood up and got ready to leave.    MDM  Anisocoria      I personally performed the services described in this documentation, which was scribed in my presence. The recorded information has been reviewed and considered.      Cheri Guppy, MD 11/04/11 1355

## 2011-11-04 NOTE — ED Notes (Signed)
In to discharge pt. Pt not in room 

## 2011-11-04 NOTE — Discharge Instructions (Signed)
You have no signs of a stroke.  The neurologist.  Does not believe that she needs a CAT scan of your head.  Today.  Followup with Dr. Margo Aye.  If your symptoms persist.  For reevaluation

## 2011-11-07 ENCOUNTER — Other Ambulatory Visit (HOSPITAL_COMMUNITY): Payer: Self-pay | Admitting: Internal Medicine

## 2011-11-07 ENCOUNTER — Encounter (HOSPITAL_COMMUNITY): Payer: Self-pay

## 2011-11-07 ENCOUNTER — Ambulatory Visit (HOSPITAL_COMMUNITY)
Admission: RE | Admit: 2011-11-07 | Discharge: 2011-11-07 | Disposition: A | Discharge status: Home / Self Care | Payer: BC Managed Care – PPO | Source: Ambulatory Visit | Attending: Internal Medicine | Admitting: Internal Medicine

## 2011-11-07 DIAGNOSIS — R51 Headache: Secondary | ICD-10-CM | POA: Insufficient documentation

## 2011-11-07 DIAGNOSIS — Z85038 Personal history of other malignant neoplasm of large intestine: Secondary | ICD-10-CM | POA: Insufficient documentation

## 2011-12-03 ENCOUNTER — Encounter: Payer: Self-pay | Admitting: Gastroenterology

## 2011-12-26 ENCOUNTER — Ambulatory Visit: Payer: BC Managed Care – PPO | Admitting: Gastroenterology

## 2011-12-26 ENCOUNTER — Ambulatory Visit: Payer: BC Managed Care – PPO | Admitting: Urgent Care

## 2011-12-30 ENCOUNTER — Encounter: Payer: Self-pay | Admitting: Gastroenterology

## 2011-12-31 ENCOUNTER — Ambulatory Visit (INDEPENDENT_AMBULATORY_CARE_PROVIDER_SITE_OTHER): Payer: BC Managed Care – PPO | Admitting: Urgent Care

## 2011-12-31 ENCOUNTER — Other Ambulatory Visit: Payer: Self-pay | Admitting: Gastroenterology

## 2011-12-31 ENCOUNTER — Encounter: Payer: Self-pay | Admitting: Urgent Care

## 2011-12-31 VITALS — BP 129/75 | HR 53 | Temp 98.2°F | Ht 64.0 in | Wt 150.4 lb

## 2011-12-31 DIAGNOSIS — Z85038 Personal history of other malignant neoplasm of large intestine: Secondary | ICD-10-CM

## 2011-12-31 DIAGNOSIS — C19 Malignant neoplasm of rectosigmoid junction: Secondary | ICD-10-CM

## 2011-12-31 MED ORDER — PEG-KCL-NACL-NASULF-NA ASC-C 100 G PO SOLR: 1.0000 | Freq: Once | ORAL | Status: DC

## 2011-12-31 NOTE — Patient Instructions (Addendum)
Colonoscopy w/ Dr Darrick Penna

## 2012-01-01 ENCOUNTER — Encounter: Payer: Self-pay | Admitting: Urgent Care

## 2012-01-01 ENCOUNTER — Telehealth: Payer: Self-pay | Admitting: Gastroenterology

## 2012-01-01 DIAGNOSIS — G4700 Insomnia, unspecified: Secondary | ICD-10-CM | POA: Insufficient documentation

## 2012-01-01 DIAGNOSIS — C20 Malignant neoplasm of rectum: Secondary | ICD-10-CM | POA: Insufficient documentation

## 2012-01-01 NOTE — Progress Notes (Signed)
Faxed to PCP

## 2012-01-01 NOTE — Telephone Encounter (Signed)
Pt was calling for Robin Wolfe's nurse. Before I could open and begin phone note patient hung up. Please return call.  Patient called back. She is out of town and wants to cancel her procedure for this Friday and she will call us later next week to [facility].

## 2012-01-01 NOTE — Assessment & Plan Note (Signed)
Doing well.  Due for surveillance colonoscopy.  I have discussed risks & benefits which include, but are not limited to, bleeding, infection, perforation & drug reaction.  The patient agrees with this plan & written consent will be obtained.

## 2012-01-01 NOTE — Progress Notes (Signed)
Primary Care Physician:  Dwana Melena, MD Primary Gastroenterologist:  Dr. Jonette Eva CC: Dr. Deveron Furlong CC:  Dr. Ubaldo Glassing  Chief Complaint  Patient presents with  . Follow-up    Hx colon ca    HPI:  Robin Wolfe is a 56 y.o. female here for follow up with hx rectal adenocarcinoma 2008 s/p ileostomy w/ takedown & chemotherapy. She has done very well.   Denies any lower GI symptoms including constipation, diarrhea, rectal bleeding, melena or weight loss.  Occasional abdominal bloating.   Denies any upper GI symptoms including heartburn, indigestion, nausea, vomiting, dysphagia, odynophagia or anorexia.  Last colonoscopy 09/2008, due now for surveillance.  Past Medical History  Diagnosis Date  . Hypercholesteremia   . 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 Insomnia   . Thrombocytopenia   . Ventral hernia     Past Surgical History  Procedure Date  . Cerebral aneurysm repair   . Colon surgery   . Cholecystectomy     ileostomyw/ takedown 8/09 Dr Flonnie Hailstone  . Abdominal hysterectomy     endometriosis  . Sigmoidoscopy 12/24/2006    rectal adenocarcinoma/Normal retroflexed view of the rectum  . 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 Colonoscopy 09/12/2008     Small rectal pouch, which made retroflexion challenging/ no polyps detected in the rectum/ No diverticula    Current Outpatient Prescriptions  Medication Sig Dispense Refill  . ALPRAZolam (XANAX) 0.5 MG tablet Take 0.5 mg by mouth 2 (two) times daily as needed. For anxiety      . carisoprodol (SOMA) 350 MG tablet Take 350 mg by mouth 3 (three) times daily as needed. For muscle relaxer      . fish oil-omega-3 fatty acids 1000 MG capsule Take 2 g by mouth daily.      . Flaxseed, Linseed, (FLAX  SEED OIL) 1300 MG CAPS Take 1 capsule by mouth every morning.      Marland Kitchen ibuprofen (ADVIL,MOTRIN) 200 MG tablet Take 800 mg by mouth every 6 (six) hours as needed. For pain      . Multiple Vitamins-Minerals (OPTI-VITAMINS PO) Take by mouth daily.      Marland Kitchen Propylene Glycol (SYSTANE BALANCE) 0.6 % SOLN Apply 2 drops to eye 3 (three) times daily as needed. For dry eyes      . psyllium (METAMUCIL) 58.6 % powder Take 1 packet by mouth daily.      . solifenacin (VESICARE) 5 MG tablet Take 10 mg by mouth every morning.      . traZODone (DESYREL) 100 MG tablet Take 100 mg by mouth at bedtime.      . peg 3350 powder (MOVIPREP) 100 G SOLR Take 1 kit (100 g total) by mouth once. As directed Please purchase 1 Fleets enema to use with the prep  1 kit  0    Allergies as of 12/31/2011 - Review Complete 12/31/2011  Allergen Reaction Noted  . Compazine Other (See Comments) 12/31/2010  . Hydromorphone hcl Other (See Comments)     Family History  Problem Relation Age of Onset  . Heart disease    . Lung disease    . Diabetes      History   Social History  . Marital Status: Married    Spouse Name: N/A  Number of Children: N/A  . Years of Education: N/A   Occupational History  . Not on file.   Social History Main Topics  . Smoking status: Former Games developer  . Smokeless tobacco: Not on file  . Alcohol Use: Yes     rarely  . Drug Use: No  . Sexually Active: Yes    Birth Control/ Protection: Surgical   Other Topics Concern  . Not on file   Social History Narrative  . No narrative on file    Review of Systems: Gen: Denies any fever, chills, sweats, anorexia, fatigue, weakness, malaise, weight loss, and sleep disorder CV: Denies chest pain, angina, palpitations, syncope, orthopnea, PND, peripheral edema, and claudication. Resp: Denies dyspnea at rest, dyspnea with exercise, cough, sputum, wheezing, coughing up blood, and pleurisy. GI: Denies vomiting blood, jaundice, and fecal incontinence.    Denies dysphagia or odynophagia. GU : Denies urinary burning, blood in urine, urinary frequency, urinary hesitancy, nocturnal urination, and urinary incontinence. MS: Denies joint pain, limitation of movement, and swelling, stiffness, low back pain, extremity pain. Denies muscle weakness, cramps, atrophy.  Derm: Denies rash, itching, dry skin, hives, moles, warts, or unhealing ulcers.  Psych: Denies depression, anxiety, memory loss, suicidal ideation, hallucinations, paranoia, and confusion. Heme: Denies bruising, bleeding, and enlarged lymph nodes. Neuro:  Denies any headaches, dizziness, paresthesias. Endo:  Denies any problems with DM, thyroid, adrenal function.  Physical Exam: BP 129/75  Pulse 53  Temp 98.2 F (36.8 C) (Temporal)  Ht 5\' 4"  (1.626 m)  Wt 150 lb 6.4 oz (68.221 kg)  BMI 25.82 kg/m2 General:   Alert,  Well-developed, well-nourished, pleasant and cooperative in NAD Head:  Normocephalic and atraumatic. Eyes:  Sclera clear, no icterus.   Conjunctiva pink. Ears:  Normal auditory acuity. Nose:  No deformity, discharge, or lesions. Mouth:  No deformity or lesions,oropharynx pink & moist. Neck:  Supple; no masses or thyromegaly. Lungs:  Clear throughout to auscultation.   No wheezes, crackles, or rhonchi. No acute distress. Heart:  Regular rate and rhythm; no murmurs, clicks, rubs,  or gallops. Abdomen:  Normal bowel sounds.  No bruits.  Soft, non-tender and non-distended without masses, hepatosplenomegaly or hernias noted.  No guarding or rebound tenderness.   Rectal:  Deferred.  Msk:  Symmetrical without gross deformities. Normal posture. Pulses:  Normal pulses noted. Extremities:  No clubbing or edema. Neurologic:  Alert and  oriented x4;  grossly normal neurologically. Skin:  Intact without significant lesions or rashes. Lymph Nodes:  No significant cervical adenopathy. Psych:  Alert and cooperative. Normal mood and affect.

## 2012-01-02 NOTE — Progress Notes (Signed)
Per Soledad Gerlach, I called pt to tell her there has been a change in her procedure appt on 01/21/2012. She should arrive at Short Stay at 12:20 PM and her procedure is scheduled for 1:20 PM.

## 2012-01-13 ENCOUNTER — Encounter (HOSPITAL_COMMUNITY): Payer: Self-pay | Admitting: Pharmacy Technician

## 2012-01-20 MED ORDER — SODIUM CHLORIDE 0.45 % IV SOLN
Freq: Once | INTRAVENOUS | Status: AC
  Administered 2012-01-21: 10:00:00 via INTRAVENOUS

## 2012-01-21 ENCOUNTER — Encounter (HOSPITAL_COMMUNITY)
Admission: RE | Disposition: A | Discharge status: Home / Self Care | Payer: Self-pay | Source: Ambulatory Visit | Attending: Gastroenterology

## 2012-01-21 ENCOUNTER — Encounter (HOSPITAL_COMMUNITY): Payer: Self-pay | Admitting: *Deleted

## 2012-01-21 ENCOUNTER — Ambulatory Visit (HOSPITAL_COMMUNITY)
Admission: RE | Admit: 2012-01-21 | Discharge: 2012-01-21 | Disposition: A | Discharge status: Home / Self Care | Payer: BC Managed Care – PPO | Source: Ambulatory Visit | Attending: Gastroenterology | Admitting: Gastroenterology

## 2012-01-21 DIAGNOSIS — K648 Other hemorrhoids: Secondary | ICD-10-CM | POA: Insufficient documentation

## 2012-01-21 DIAGNOSIS — Z1211 Encounter for screening for malignant neoplasm of colon: Secondary | ICD-10-CM

## 2012-01-21 DIAGNOSIS — Z85038 Personal history of other malignant neoplasm of large intestine: Secondary | ICD-10-CM

## 2012-01-21 DIAGNOSIS — D126 Benign neoplasm of colon, unspecified: Secondary | ICD-10-CM

## 2012-01-21 DIAGNOSIS — E7800 Pure hypercholesterolemia, unspecified: Secondary | ICD-10-CM | POA: Insufficient documentation

## 2012-01-21 HISTORY — PX: COLONOSCOPY: SHX5424

## 2012-01-21 SURGERY — COLONOSCOPY
Anesthesia: Moderate Sedation

## 2012-01-21 MED ORDER — MIDAZOLAM HCL 5 MG/5ML IJ SOLN
INTRAMUSCULAR | Status: AC
  Filled 2012-01-21: qty 10

## 2012-01-21 MED ORDER — MIDAZOLAM HCL 5 MG/5ML IJ SOLN
INTRAMUSCULAR | Status: DC | PRN
  Administered 2012-01-21 (×4): 2 mg via INTRAVENOUS

## 2012-01-21 MED ORDER — MEPERIDINE HCL 100 MG/ML IJ SOLN
INTRAMUSCULAR | Status: DC | PRN
  Administered 2012-01-21 (×4): 25 mg

## 2012-01-21 MED ORDER — PROMETHAZINE HCL 25 MG/ML IJ SOLN
INTRAMUSCULAR | Status: DC | PRN
  Administered 2012-01-21: 12.5 mg via INTRAVENOUS

## 2012-01-21 MED ORDER — MEPERIDINE HCL 100 MG/ML IJ SOLN
INTRAMUSCULAR | Status: AC
  Filled 2012-01-21: qty 2

## 2012-01-21 NOTE — H&P (Signed)
Primary Care Physician:  Dwana Melena, MD Primary Gastroenterologist:  Dr. Darrick Penna  Pre-Procedure History & Physical: HPI:  Robin Wolfe is a 56 y.o. female here for  PERSONAL HISTORY OF colon cancer 2008.   Past Medical History  Diagnosis Date  . Hypercholesteremia   . 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 Insomnia   . Thrombocytopenia   . Ventral hernia     Past Surgical History  Procedure Date  . Cerebral aneurysm repair   . Colon surgery   . Cholecystectomy     ileostomyw/ takedown 8/09 Dr Flonnie Hailstone  . Abdominal hysterectomy     endometriosis  . Sigmoidoscopy 12/24/2006    rectal adenocarcinoma/Normal retroflexed view of the rectum  . 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 Colonoscopy 09/12/2008     Small rectal pouch, which made retroflexion challenging/ no polyps detected in the rectum/ No diverticula  . Ventral hernia repair     Prior to Admission medications   Medication Sig Start Date End Date Taking? Authorizing Provider  ALPRAZolam Prudy Feeler) 0.5 MG tablet Take 0.5 mg by mouth 2 (two) times daily as needed. For anxiety   Yes Historical Provider, MD  aspirin EC 325 MG tablet Take 325 mg by mouth at bedtime.   Yes Historical Provider, MD  beta carotene w/minerals (OCUVITE) tablet Take 1 tablet by mouth daily.   Yes Historical Provider, MD  carisoprodol (SOMA) 350 MG tablet Take 350 mg by mouth 3 (three) times daily as needed. For muscle relaxer   Yes Historical Provider, MD  fish oil-omega-3 fatty acids 1000 MG capsule Take 2 g by mouth daily.   Yes Historical Provider, MD  Flaxseed, Linseed, (FLAX SEED OIL) 1300 MG CAPS Take 2 capsules by mouth every morning.    Yes Historical Provider, MD  ibuprofen (ADVIL,MOTRIN) 200 MG tablet Take  800 mg by mouth every 6 (six) hours as needed. For pain   Yes Historical Provider, MD  niacin (NIASPAN) 500 MG CR tablet Take 500 mg by mouth at bedtime.   Yes Historical Provider, MD  Propylene Glycol (SYSTANE BALANCE) 0.6 % SOLN Apply 2 drops to eye 3 (three) times daily as needed. For dry eyes   Yes Historical Provider, MD  psyllium (METAMUCIL) 58.6 % powder Take 1 packet by mouth daily.   Yes Historical Provider, MD  solifenacin (VESICARE) 5 MG tablet Take 5 mg by mouth every morning.    Yes Historical Provider, MD  White Petrolatum-Mineral Oil (SYSTANE NIGHTTIME) OINT Apply 1 drop to eye at bedtime.   Yes Historical Provider, MD  traZODone (DESYREL) 150 MG tablet Take 150 mg by mouth at bedtime.    Historical Provider, MD    Allergies as of 12/31/2011 - Review Complete 12/31/2011  Allergen Reaction Noted  . Compazine Other (See Comments) 12/31/2010  . Hydromorphone hcl Other (See Comments)     Family History  Problem Relation Age of Onset  . Heart disease    . Lung disease    . Diabetes    . Colon cancer Neg Hx     History   Social History  . Marital Status: Married    Spouse Name: N/A    Number of Children: N/A  . Years of Education: N/A   Occupational History  .  Not on file.   Social History Main Topics  . Smoking status: Former Games developer  . Smokeless tobacco: Not on file  . Alcohol Use: Yes     rarely  . Drug Use: No  . Sexually Active: Yes    Birth Control/ Protection: Surgical   Other Topics Concern  . Not on file   Social History Narrative  . No narrative on file    Review of Systems: See HPI, otherwise negative ROS   Physical Exam: BP 127/69  Pulse 49  Temp 98 F (36.7 C) (Oral)  Resp 12  Ht 5\' 4"  (1.626 m)  Wt 150 lb (68.04 kg)  BMI 25.75 kg/m2  SpO2 100% General:   Alert,  pleasant and cooperative in NAD Head:  Normocephalic and atraumatic. Neck:  Supple;  Lungs:  Clear throughout to auscultation.    Heart:  Regular rate and  rhythm. Abdomen:  Soft, nontender and nondistended. Normal bowel sounds, without guarding, and without rebound.   Neurologic:  Alert and  oriented x4;  grossly normal neurologically.  Impression/Plan:     SCREENING  Plan:  1. TCS TODAY

## 2012-01-21 NOTE — Op Note (Signed)
[facility] [address] [place], [address]   COLONOSCOPY PROCEDURE REPORT  PATIENT: Robin Wolfe, Robin Wolfe  MR#: [id number] BIRTHDATE: 1955-06-22 , 56  yrs. old GENDER: Female ENDOSCOPIST: Jonette Eva, MD REFERRED OZ:HYQM Hall, M.D. PROCEDURE DATE:  01/21/2012  PROCEDURE:   Colonoscopy with biopsy  INDICATIONS:high risk patient with personal history of colon cancer IN 2008 . ASA CLASSIFICATION: MEDICATIONS: Demerol 100 mg IV, Versed 8 mg IV, and promethazine (Phenergan) 12.5 mg IV  DESCRIPTION OF PROCEDURE:    Physical exam was performed.  Informed consent was obtained from the patient after explaining the benefits, risks, and alternatives to procedure.  The patient was connected to monitor and placed in left lateral position. Continuous oxygen was provided by nasal cannula and IV medicine administered through an indwelling cannula.  After administration of sedation and rectal exam, the patients rectum was intubated and the EC-3890Li ([id number]) and EG-[id number] ([id number])  colonoscope was advanced under direct visualization to the cecum.  The scope was removed slowly by carefully examining the color, texture, anatomy, and integrity mucosa on the way out.  The patient was recovered in endoscopy and discharged home in satisfactory condition.     COLON FINDINGS: 1. Two 2-3 MM sessile polyps were found in the sigmoid colon. Polypectomy was performed with cold forceps. 2.  Small internal hemorrhoids were found.   PREP QUALITY:      Excellent. CECAL W/D TIME: 13 minutes  COMPLICATIONS: None  ENDOSCOPIC IMPRESSION: 1.   Two sessile polyps were found in the sigmoid colon 2.   Small internal hemorrhoids   RECOMMENDATIONS: 1.  Await biopsy results 2. HIGH FIBER DIET 3. TCS IN 5 YEARS.  CONSIDER PROPOFOL.       _______________________________eSigned:  Jonette Eva, MD 01/21/2012 12:12 PM

## 2012-01-27 ENCOUNTER — Encounter (HOSPITAL_COMMUNITY): Payer: Self-pay | Admitting: Gastroenterology

## 2012-01-27 ENCOUNTER — Telehealth: Payer: Self-pay | Admitting: Gastroenterology

## 2012-01-27 NOTE — Telephone Encounter (Signed)
LMOM to call back

## 2012-01-27 NOTE — Telephone Encounter (Signed)
Please call pt. She had a polypoid lesion removed and it was benign. High fiber diet. TCS in 5 years.

## 2012-01-28 NOTE — Telephone Encounter (Signed)
Recall made 

## 2012-01-28 NOTE — Telephone Encounter (Signed)
Pt aware of results 

## 2012-01-30 NOTE — Progress Notes (Signed)
.  AUG 2013 BENIGN POLYPOID TISSUEUE  REVIEWED.

## 2015-01-02 ENCOUNTER — Telehealth: Payer: Self-pay | Admitting: Cardiovascular Disease

## 2015-01-02 NOTE — Telephone Encounter (Signed)
Received records from Dr Wende Neighbors for appointment on 01/06/15 with Dr Oval Linsey.  Records given to [facility] (medical records) for Dr Blenda Mounts schedule on 01/06/15. lp

## 2015-01-06 ENCOUNTER — Encounter: Payer: Self-pay | Admitting: Cardiovascular Disease

## 2015-01-06 ENCOUNTER — Ambulatory Visit (INDEPENDENT_AMBULATORY_CARE_PROVIDER_SITE_OTHER): Payer: 59 | Admitting: Cardiovascular Disease

## 2015-01-06 ENCOUNTER — Encounter (INDEPENDENT_AMBULATORY_CARE_PROVIDER_SITE_OTHER): Payer: 59

## 2015-01-06 VITALS — BP 142/84 | HR 51 | Ht 60.0 in | Wt 145.8 lb

## 2015-01-06 DIAGNOSIS — R42 Dizziness and giddiness: Secondary | ICD-10-CM | POA: Diagnosis not present

## 2015-01-06 DIAGNOSIS — R110 Nausea: Secondary | ICD-10-CM

## 2015-01-06 DIAGNOSIS — R5383 Other fatigue: Secondary | ICD-10-CM | POA: Diagnosis not present

## 2015-01-06 DIAGNOSIS — Z01818 Encounter for other preprocedural examination: Secondary | ICD-10-CM

## 2015-01-06 DIAGNOSIS — R001 Bradycardia, unspecified: Secondary | ICD-10-CM

## 2015-01-06 DIAGNOSIS — R0789 Other chest pain: Secondary | ICD-10-CM

## 2015-01-06 NOTE — Progress Notes (Signed)
Cardiology Office Note   Date:  01/06/2015   ID:  Robin Wolfe, DOB 1956-02-01, MRN [id number]  PCP:  Delphina Cahill, MD  Cardiologist:   Sharol Harness, MD   Chief Complaint  Patient presents with  . New Evaluation    patient reports dizziness-started taking BP and noticed HR was low, fatigue, nausea, and chest heaviness      History of Present Illness: Robin Wolfe is a 59 y.o. female with prior colon cancer s/p ileostomy with take down and chemotherapy who presents for an evaluation of bradycardia.  Robin Wolfe reports 3 months of intermittent dizziness and fatigue. She has had some severe episodes while at work, where she felt as though she were going to pass out. She had her blood pressure and heart rate checked at the time and her heart rate was 45 with a blood pressure of 91/50. She was not taking any medications at that time. She denies chest pain or shortness of breath, but she does have chest pressure during bad episodes. In the past was happening more intermittently, however more recently it's been occurring daily. 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 She denies lower extremity edema, orthopnea, PND, or weight change.  Robin Wolfe has previously been on statins for her hyperlipidemia. She has been unable to tolerate atorvastatin, simvastatin, or rosuvastatin. She has muscle spasms at baseline and the statins make the spasms worse and also cause her to have muscle aches. She was previously taking Zetia however she stopped this 5 months ago because it became too expensive.    Past Medical History  Diagnosis Date  . Hypercholesteremia   . 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 Insomnia   . Thrombocytopenia   . Ventral hernia     Past Surgical  History  Procedure Laterality Date  . Cerebral aneurysm repair    . Colon surgery    . Cholecystectomy      ileostomyw/ takedown 8/09 Dr Crisoforo Oxford  . Abdominal hysterectomy      endometriosis  . Sigmoidoscopy  12/24/2006    rectal adenocarcinoma/Normal retroflexed view of the rectum  . 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 Colonoscopy  09/12/2008     Small rectal pouch, which made retroflexion challenging/ no polyps detected in the rectum/ No diverticula  . Ventral hernia repair    . Colonoscopy  01/21/2012    Procedure: COLONOSCOPY;  Surgeon: Danie Binder, MD;  Location: [facility];  Service: Endoscopy;  Laterality: N/A;     Current Outpatient Prescriptions  Medication Sig Dispense Refill  . citalopram (CELEXA) 20 MG tablet Take 1 tablet by mouth daily.    Marland Kitchen Propylene Glycol (SYSTANE BALANCE) 0.6 % SOLN Apply 2 drops to eye 3 (three) times daily as needed. For dry eyes    . psyllium (METAMUCIL) 58.6 % powder Take 1 packet by mouth daily.    . traZODone (DESYREL) 150 MG tablet Take 150 mg by mouth at bedtime.     No current facility-administered medications for this  visit.    Allergies:   Compazine and Hydromorphone hcl    Social History:  The patient  reports that she quit smoking about 8 years ago. Her smoking use included Cigarettes. She has a 12 pack-year smoking history. She does not have any smokeless tobacco history on file. She reports that she drinks alcohol. She reports that she does not use illicit drugs.   Family History:  The patient's family history includes Diabetes in an other family member; Heart disease in an other family member; Lung disease in an other family member. There is no history of Colon cancer.    ROS:  Please see the history of present  illness.   Otherwise, review of systems are positive for none.   All other systems are reviewed and negative.    PHYSICAL EXAM: VS:  BP 142/84 mmHg  Pulse 51  Ht 5' (1.524 m)  Wt 66.134 kg (145 lb 12.8 oz)  BMI 28.47 kg/m2  PF 4 L/min , BMI Body mass index is 28.47 kg/(m^2). GENERAL:  Well appearing HEENT:  Pupils equal round and reactive, fundi not visualized, oral mucosa unremarkable NECK:  No jugular venous distention, waveform within normal limits, carotid upstroke brisk and symmetric, no bruits, no thyromegaly LYMPHATICS:  No cervical adenopathy LUNGS:  Clear to auscultation bilaterally HEART:  RRR.  PMI not displaced or sustained, nl S1, prominent S2, no S3, no S4, no clicks, no rubs, II/VI systolic murmur at LUSB with radiation to bilateral carotids (longstanding per patient).  ABD:  Flat, positive bowel sounds normal in frequency in pitch, no bruits, no rebound, no guarding, no midline pulsatile mass, no hepatomegaly, no splenomegaly EXT:  2 plus pulses throughout, no edema, no cyanosis no clubbing SKIN:  No rashes no nodules NEURO:  Cranial nerves II through XII grossly intact, motor grossly intact throughout PSYCH:  Cognitively intact, oriented to person place and time    EKG:  EKG is ordered today. The ekg ordered today demonstrates sinus bradycardia at 51 bpm.  LAFB.  Prior anteroseptal infarct.   Recent Labs: No results found for requested labs within last 365 days.    Lipid Panel No results found for: CHOL, TRIG, HDL, CHOLHDL, VLDL, LDLCALC, LDLDIRECT    Wt Readings from Last 3 Encounters:  01/06/15 66.134 kg (145 lb 12.8 oz)  01/21/12 68.04 kg (150 lb)  12/31/11 68.221 kg (150 lb 6.4 oz)      Other studies Reviewed: Additional studies/ records that were reviewed today include: medical record . Review of the above records demonstrates:  Please see elsewhere in the note.     ASSESSMENT AND PLAN:  # Bradycardia: [name] has symptomatic bradycardia with  reported episodes of heart rates in the 40s while having symptoms. Her heart rate today is 51. She also seems to have these episodes with exertion, which could be related to  chronotropic incompetence versus ischemia. She will likely need a pacemaker as there are no other clear precipitants for her symptoms. However we will pursue ischemia eval first.  - 24h Holter to document bradycardia and symptoms - Exercise Myoview for chronotropic competence and ischemia eval - CBC, BMP, Mg, coags, lipids, TSH - EP referral for PPM placement  # Hyperlipidemia: Robin Wolfe has been intolerant of statins and has tried intermittent dosing. Her lipids have not been checked in a while and she is not currently on any cholesterol medication. We will check her lipids today and determine management based on these values.   Current medicines  are reviewed at length with the patient today.  The patient does not have concerns regarding medicines.  The following changes have been made:  no change  Labs/ tests ordered today include: 24h Holter, exercise myoview, CBC, chem 7, mg, bmp, coags, TSH  No orders of the defined types were placed in this encounter.     Disposition:   FU with EP within 2 weeks, Dr. Oval Linsey in 1 year   Signed, Sharol Harness, MD  01/06/2015 11:55 AM    [place]

## 2015-01-06 NOTE — Patient Instructions (Signed)
LABS- CBC,BMP,MAGNESIUM, COAG., TSH, LIPID   You have been referred to E.P. DOCTOR at  [place] [facility] [place] physician has recommended that you wear a holter monitor. Holter monitors are medical devices that record the heart's electrical activity. Doctors most often use these monitors to diagnose arrhythmias. Arrhythmias are problems with the speed or rhythm of the heartbeat. The monitor is a small, portable device. You can wear one while you do your normal daily activities. This is usually used to diagnose what is causing palpitations/syncope (passing out).   Your physician has requested that you have en exercise stress myoview. For further information please visit [email or web address]. Please follow instruction sheet, as given.    Your physician wants you to follow-up in 12 months Dr Oval Linsey.  You will receive a reminder letter in the mail two months in advance. If you don't receive a letter, please call our office to schedule the follow-up appointment.

## 2015-01-08 ENCOUNTER — Other Ambulatory Visit (HOSPITAL_COMMUNITY)
Admission: RE | Admit: 2015-01-08 | Discharge: 2015-01-08 | Disposition: A | Discharge status: Home / Self Care | Payer: 59 | Source: Ambulatory Visit | Attending: Cardiovascular Disease | Admitting: Cardiovascular Disease

## 2015-01-08 DIAGNOSIS — Z01818 Encounter for other preprocedural examination: Secondary | ICD-10-CM | POA: Insufficient documentation

## 2015-01-08 DIAGNOSIS — R110 Nausea: Secondary | ICD-10-CM | POA: Diagnosis not present

## 2015-01-08 DIAGNOSIS — R5383 Other fatigue: Secondary | ICD-10-CM | POA: Diagnosis not present

## 2015-01-08 DIAGNOSIS — R0789 Other chest pain: Secondary | ICD-10-CM | POA: Diagnosis not present

## 2015-01-08 DIAGNOSIS — R42 Dizziness and giddiness: Secondary | ICD-10-CM | POA: Insufficient documentation

## 2015-01-08 DIAGNOSIS — R001 Bradycardia, unspecified: Secondary | ICD-10-CM | POA: Diagnosis present

## 2015-01-08 LAB — CBC
HEMATOCRIT: 39.4 % (ref 36.0–46.0)
HEMOGLOBIN: 13.2 g/dL (ref 12.0–15.0)
MCH: 30.6 pg (ref 26.0–34.0)
MCHC: 33.5 g/dL (ref 30.0–36.0)
MCV: 91.2 fL (ref 78.0–100.0)
Platelets: 88 10*3/uL — ABNORMAL LOW (ref 150–400)
RBC: 4.32 MIL/uL (ref 3.87–5.11)
RDW: 13.9 % (ref 11.5–15.5)
WBC: 3.3 10*3/uL — AB (ref 4.0–10.5)

## 2015-01-08 LAB — BASIC METABOLIC PANEL
ANION GAP: 7 (ref 5–15)
BUN: 12 mg/dL (ref 6–20)
CALCIUM: 9.4 mg/dL (ref 8.9–10.3)
CHLORIDE: 104 mmol/L (ref 101–111)
CO2: 27 mmol/L (ref 22–32)
Creatinine, Ser: 0.86 mg/dL (ref 0.44–1.00)
GFR calc non Af Amer: >60 mL/min (ref >60)
Glucose, Bld: 106 mg/dL — ABNORMAL HIGH (ref 65–99)
POTASSIUM: 4 mmol/L (ref 3.5–5.1)
Sodium: 138 mmol/L (ref 135–145)

## 2015-01-08 LAB — LIPID PANEL
Cholesterol: 238 mg/dL — ABNORMAL HIGH (ref 0–200)
HDL: 38 mg/dL — AB (ref >40)
LDL Cholesterol: 170 mg/dL — ABNORMAL HIGH (ref 0–99)
TRIGLYCERIDES: 148 mg/dL (ref <150)
Total CHOL/HDL Ratio: 6.3 RATIO
VLDL: 30 mg/dL (ref 0–40)

## 2015-01-08 LAB — PROTIME-INR
INR: 1.13 (ref 0.00–1.49)
PROTHROMBIN TIME: 14.7 s (ref 11.6–15.2)

## 2015-01-08 LAB — MAGNESIUM: Magnesium: 2 mg/dL (ref 1.7–2.4)

## 2015-01-08 LAB — APTT: aPTT: 35 seconds (ref 24–37)

## 2015-01-08 LAB — TSH: TSH: 0.749 u[IU]/mL (ref 0.350–4.500)

## 2015-01-09 ENCOUNTER — Telehealth: Payer: Self-pay | Admitting: Cardiovascular Disease

## 2015-01-09 NOTE — Telephone Encounter (Signed)
Patient states her monitor kept falling off her belt and she only has about 7 hours of transmission.  She is supposed to bring it back today.  Please advise.

## 2015-01-09 NOTE — Telephone Encounter (Signed)
Patient was ordered to wear 24 hour holter monitor. She reports she forgot it was hooked to her pants and it fell off and the batteries came out and then the screen went blank. She reports she only got about 7 hours on monitoring in. Advised her to bring monitor in and then it will be uploaded. Informed her the MD would need to review the info obtained to see if this was enough info to make a clinical decision, if not she may need to re-wear monitor. She voiced understanding.   Routed to [place], [place] (uploads monitors) and Dr. Oval Linsey as Juluis Rainier

## 2015-01-11 ENCOUNTER — Telehealth (HOSPITAL_COMMUNITY): Payer: Self-pay

## 2015-01-11 NOTE — Telephone Encounter (Signed)
Encounter complete. 

## 2015-01-12 ENCOUNTER — Ambulatory Visit (HOSPITAL_COMMUNITY)
Admission: RE | Admit: 2015-01-12 | Discharge: 2015-01-12 | Disposition: A | Discharge status: Home / Self Care | Payer: 59 | Source: Ambulatory Visit | Attending: Cardiology | Admitting: Cardiology

## 2015-01-12 ENCOUNTER — Telehealth: Payer: Self-pay | Admitting: *Deleted

## 2015-01-12 DIAGNOSIS — R5383 Other fatigue: Secondary | ICD-10-CM

## 2015-01-12 DIAGNOSIS — Z01818 Encounter for other preprocedural examination: Secondary | ICD-10-CM

## 2015-01-12 DIAGNOSIS — R0789 Other chest pain: Secondary | ICD-10-CM

## 2015-01-12 DIAGNOSIS — R9439 Abnormal result of other cardiovascular function study: Secondary | ICD-10-CM

## 2015-01-12 DIAGNOSIS — R42 Dizziness and giddiness: Secondary | ICD-10-CM

## 2015-01-12 DIAGNOSIS — R001 Bradycardia, unspecified: Secondary | ICD-10-CM | POA: Diagnosis not present

## 2015-01-12 DIAGNOSIS — R110 Nausea: Secondary | ICD-10-CM

## 2015-01-12 LAB — MYOCARDIAL PERFUSION IMAGING
CHL CUP MPHR: 163 {beats}/min
CHL CUP NUCLEAR SRS: 4
CHL CUP RESTING HR STRESS: 48 {beats}/min
CHL RATE OF PERCEIVED EXERTION: 16
CSEPHR: 84 %
CSEPPHR: 137 {beats}/min
Estimated workload: 10.1 METS
Exercise duration (min): 8 min
LV sys vol: 47 mL
LVDIAVOL: 99 mL
NUC STRESS TID: 1.02
SDS: 2
SSS: 6

## 2015-01-12 MED ORDER — TECHNETIUM TC 99M SESTAMIBI GENERIC - CARDIOLITE
10.4000 | Freq: Once | INTRAVENOUS | Status: AC | PRN
  Administered 2015-01-12: 10 via INTRAVENOUS

## 2015-01-12 MED ORDER — TECHNETIUM TC 99M SESTAMIBI GENERIC - CARDIOLITE
31.2000 | Freq: Once | INTRAVENOUS | Status: AC | PRN
  Administered 2015-01-12: 31.2 via INTRAVENOUS

## 2015-01-12 NOTE — Telephone Encounter (Signed)
-----   Message from Skeet Latch, MD sent at 01/12/2015  3:26 PM EDT ----- Please let Ms. Kostelecky know that her stress test was abnormal and is concerning for coronary artery disease.  She will need to be scheduled for cardiac cath.  Her 24 H Holter showed bradycardia but no long pauses.

## 2015-01-12 NOTE — Telephone Encounter (Signed)
Called home phone number x 4. Phone rings then no tone . Will attempt call tomorrow 01/13/15.

## 2015-01-12 NOTE — Telephone Encounter (Signed)
-----   Message from Skeet Latch, MD sent at 01/09/2015  4:56 PM EDT ----- TSH, BMP and coags are normal.  Platelet count was low, and has been trending down on last several checks.  Please follow up with PCP.  Lipid panel shows that her 10 year risk of CV disease is 4.7%, which is under the 5% when we typically start statins.  Since she has been intolerant of statins in the past, we can either refer her to lipid clinic or she can work on diet and exercise.  Whichever she prefers is OK with me.

## 2015-01-12 NOTE — Telephone Encounter (Signed)
-----   Message from Skeet Latch, MD sent at 01/12/2015  3:26 PM EDT ----- Please let Ms. Tarman know that her stress test was abnormal and is concerning for coronary artery disease.  She will need to be scheduled for cardiac cath.  Her 24 H Holter showed bradycardia but no long pauses.

## 2015-01-12 NOTE — Telephone Encounter (Signed)
Unable to get a dial tone Will try on 01/13/15

## 2015-01-13 ENCOUNTER — Telehealth: Payer: Self-pay | Admitting: *Deleted

## 2015-01-13 ENCOUNTER — Other Ambulatory Visit: Payer: Self-pay | Admitting: *Deleted

## 2015-01-13 DIAGNOSIS — Z01818 Encounter for other preprocedural examination: Secondary | ICD-10-CM

## 2015-01-13 NOTE — Telephone Encounter (Signed)
-----   Message from Corinna Lines sent at 01/13/2015 11:11 AM EDT ----- Regarding: cath   Robin Wolfe  The patient has been set up for Monday 01-16-15 at 9 a.m., with Dr. Angelena Form.  I have also called her and gone over the information.  Thanks [facility]

## 2015-01-13 NOTE — Telephone Encounter (Signed)
ORDER PLACED AND COMPLETED  FOR CARDIAC CATH 01/16/15.

## 2015-01-13 NOTE — Telephone Encounter (Signed)
PATIENT RETURN THE CALL.  RN INFORMED PATIENT OF ABNORMAL STRESS  ,PER DR [place] RECOMMEND CARDIAC CATH FOR NEXT WEEK IF POSSIBLE ON Monday 01/16/15. CANCEL APPOINTMENT WITH DR Caryl Comes. LAB WORK IS COMPLETED. PATIENT STATES SHE IS IN AGREEMENT TO HAVE CATH DONE.  RN INFORMED PATIENT SOMEONE WILL CONTACT HER DAY WITH INSTRUCTIONS.

## 2015-01-13 NOTE — Telephone Encounter (Signed)
LEFT MESSAGE TO RETURN CALL BACK.

## 2015-01-16 ENCOUNTER — Institutional Professional Consult (permissible substitution): Payer: 59 | Admitting: Internal Medicine

## 2015-01-16 ENCOUNTER — Ambulatory Visit (HOSPITAL_COMMUNITY)
Admission: RE | Admit: 2015-01-16 | Discharge: 2015-01-16 | Disposition: A | Discharge status: Home / Self Care | Payer: 59 | Source: Ambulatory Visit | Attending: Cardiovascular Disease | Admitting: Cardiovascular Disease

## 2015-01-16 ENCOUNTER — Encounter (HOSPITAL_COMMUNITY)
Admission: RE | Disposition: A | Discharge status: Home / Self Care | Payer: Self-pay | Source: Ambulatory Visit | Attending: Cardiovascular Disease

## 2015-01-16 ENCOUNTER — Encounter (HOSPITAL_COMMUNITY): Payer: Self-pay | Admitting: Cardiovascular Disease

## 2015-01-16 DIAGNOSIS — R9430 Abnormal result of cardiovascular function study, unspecified: Secondary | ICD-10-CM | POA: Diagnosis present

## 2015-01-16 DIAGNOSIS — G4700 Insomnia, unspecified: Secondary | ICD-10-CM | POA: Insufficient documentation

## 2015-01-16 DIAGNOSIS — I2510 Atherosclerotic heart disease of native coronary artery without angina pectoris: Secondary | ICD-10-CM | POA: Diagnosis not present

## 2015-01-16 DIAGNOSIS — Z85038 Personal history of other malignant neoplasm of large intestine: Secondary | ICD-10-CM | POA: Insufficient documentation

## 2015-01-16 DIAGNOSIS — D696 Thrombocytopenia, unspecified: Secondary | ICD-10-CM | POA: Diagnosis not present

## 2015-01-16 DIAGNOSIS — Z87891 Personal history of nicotine dependence: Secondary | ICD-10-CM | POA: Diagnosis not present

## 2015-01-16 DIAGNOSIS — Z01818 Encounter for other preprocedural examination: Secondary | ICD-10-CM

## 2015-01-16 DIAGNOSIS — E780 Pure hypercholesterolemia: Secondary | ICD-10-CM | POA: Diagnosis not present

## 2015-01-16 HISTORY — PX: CARDIAC CATHETERIZATION: SHX172

## 2015-01-16 SURGERY — LEFT HEART CATH AND CORONARY ANGIOGRAPHY
Anesthesia: LOCAL

## 2015-01-16 MED ORDER — SODIUM CHLORIDE 0.9 % IJ SOLN: 3.0000 mL | Freq: Two times a day (BID) | INTRAMUSCULAR | Status: DC

## 2015-01-16 MED ORDER — VERAPAMIL HCL 2.5 MG/ML IV SOLN
INTRAVENOUS | Status: DC | PRN
  Administered 2015-01-16: 10:00:00 via INTRA_ARTERIAL

## 2015-01-16 MED ORDER — FENTANYL CITRATE (PF) 100 MCG/2ML IJ SOLN
INTRAMUSCULAR | Status: DC | PRN
  Administered 2015-01-16 (×2): 25 ug via INTRAVENOUS

## 2015-01-16 MED ORDER — LIDOCAINE HCL (PF) 1 % IJ SOLN
INTRAMUSCULAR | Status: AC
  Filled 2015-01-16: qty 30

## 2015-01-16 MED ORDER — HEPARIN SODIUM (PORCINE) 1000 UNIT/ML IJ SOLN
INTRAMUSCULAR | Status: DC | PRN
  Administered 2015-01-16: 3500 [IU] via INTRAVENOUS

## 2015-01-16 MED ORDER — HEPARIN SODIUM (PORCINE) 1000 UNIT/ML IJ SOLN
INTRAMUSCULAR | Status: AC
  Filled 2015-01-16: qty 1

## 2015-01-16 MED ORDER — SODIUM CHLORIDE 0.9 % IV SOLN
INTRAVENOUS | Status: DC
Start: 2015-01-16 — End: 2015-01-16
  Administered 2015-01-16: 08:00:00 via INTRAVENOUS

## 2015-01-16 MED ORDER — HEPARIN (PORCINE) IN NACL 2-0.9 UNIT/ML-% IJ SOLN
INTRAMUSCULAR | Status: AC
  Filled 2015-01-16: qty 1000

## 2015-01-16 MED ORDER — ASPIRIN 325 MG PO TABS
325.0000 mg | ORAL_TABLET | Freq: Once | ORAL | Status: AC
  Administered 2015-01-16: 325 mg via ORAL

## 2015-01-16 MED ORDER — MIDAZOLAM HCL 2 MG/2ML IJ SOLN
INTRAMUSCULAR | Status: AC
  Filled 2015-01-16: qty 4

## 2015-01-16 MED ORDER — ASPIRIN 81 MG PO CHEW
CHEWABLE_TABLET | ORAL | Status: AC
  Filled 2015-01-16: qty 3

## 2015-01-16 MED ORDER — IOHEXOL 350 MG/ML SOLN
INTRAVENOUS | Status: DC | PRN
  Administered 2015-01-16: 60 mL via INTRAVENOUS

## 2015-01-16 MED ORDER — SODIUM CHLORIDE 0.9 % IV SOLN: INTRAVENOUS | Status: AC

## 2015-01-16 MED ORDER — VERAPAMIL HCL 2.5 MG/ML IV SOLN
INTRAVENOUS | Status: AC
  Filled 2015-01-16: qty 2

## 2015-01-16 MED ORDER — SODIUM CHLORIDE 0.9 % IV SOLN: 250.0000 mL | INTRAVENOUS | Status: DC | PRN

## 2015-01-16 MED ORDER — MIDAZOLAM HCL 2 MG/2ML IJ SOLN
INTRAMUSCULAR | Status: DC | PRN
  Administered 2015-01-16: 2 mg via INTRAVENOUS
  Administered 2015-01-16: 1 mg via INTRAVENOUS

## 2015-01-16 MED ORDER — SODIUM CHLORIDE 0.9 % IJ SOLN: 3.0000 mL | INTRAMUSCULAR | Status: DC | PRN

## 2015-01-16 MED ORDER — FENTANYL CITRATE (PF) 100 MCG/2ML IJ SOLN
INTRAMUSCULAR | Status: AC
  Filled 2015-01-16: qty 4

## 2015-01-16 MED ORDER — ASPIRIN 81 MG PO CHEW
CHEWABLE_TABLET | ORAL | Status: DC
Start: 2015-01-16 — End: 2015-01-16
  Filled 2015-01-16: qty 1

## 2015-01-16 SURGICAL SUPPLY — 14 items
CATH INFINITI 5 FR JL3.5 (CATHETERS) ×2 IMPLANT
CATH INFINITI 5FR ANG PIGTAIL (CATHETERS) ×2 IMPLANT
CATH INFINITI JR4 5F (CATHETERS) ×2 IMPLANT
COVER PRB 48X5XTLSCP FOLD TPE (BAG) IMPLANT
COVER PROBE 5X48 (BAG) ×2
DEVICE RAD COMP TR BAND LRG (VASCULAR PRODUCTS) ×2 IMPLANT
GLIDESHEATH SLEND SS 6F .021 (SHEATH) ×2 IMPLANT
KIT HEART LEFT (KITS) ×2 IMPLANT
PACK CARDIAC CATHETERIZATION (CUSTOM PROCEDURE TRAY) ×2 IMPLANT
SYR MEDRAD MARK V 150ML (SYRINGE) ×2 IMPLANT
TRANSDUCER W/STOPCOCK (MISCELLANEOUS) ×2 IMPLANT
TUBING CIL FLEX 10 FLL-RA (TUBING) ×2 IMPLANT
WIRE HI TORQ VERSACORE-J 145CM (WIRE) ×1 IMPLANT
WIRE SAFE-T 1.5MM-J .035X260CM (WIRE) ×2 IMPLANT

## 2015-01-16 NOTE — Research (Signed)
CADLAB Informed Consent   Subject Name: MAGDALINE ZOLLARS  Subject met inclusion and exclusion criteria.  The informed consent form, study requirements and expectations were reviewed with the subject and questions and concerns were addressed prior to the signing of the consent form.  The subject verbalized understanding of the trail requirements.  The subject agreed to participate in the CADLAB trial and signed the informed consent.  The informed consent was obtained prior to performance of any protocol-specific procedures for the subject.  A copy of the signed informed consent was given to the subject and a copy was placed in the subject's medical record.  Sandie Ano 01/16/2015, 7:50

## 2015-01-16 NOTE — H&P (View-Only) (Signed)
Cardiology Office Note   Date:  01/06/2015   ID:  CASH MEADOW, DOB [birth year], MRN [id number]  PCP:  Delphina Cahill, MD  Cardiologist:   Sharol Harness, MD   Chief Complaint  Patient presents with  . New Evaluation    patient reports dizziness-started taking BP and noticed HR was low, fatigue, nausea, and chest heaviness      History of Present Illness: Robin Wolfe is a 59 y.o. female with prior colon cancer s/p ileostomy with take down and chemotherapy who presents for an evaluation of bradycardia.  Robin Wolfe reports 3 months of intermittent dizziness and fatigue. She has had some severe episodes while at work, where she felt as though she were going to pass out. She had her blood pressure and heart rate checked at the time and her heart rate was 45 with a blood pressure of 91/50. She was not taking any medications at that time. She denies chest pain or shortness of breath, but she does have chest pressure during bad episodes. In the past was happening more intermittently, however more recently it's been occurring daily. 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 She denies lower extremity edema, orthopnea, PND, or weight change.  Robin Wolfe has previously been on statins for her hyperlipidemia. She has been unable to tolerate atorvastatin, simvastatin, or rosuvastatin. She has muscle spasms at baseline and the statins make the spasms worse and also cause her to have muscle aches. She was previously taking Zetia however she stopped this 5 months ago because it became too expensive.    Past Medical History  Diagnosis Date  . Hypercholesteremia   . 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 Insomnia   . Thrombocytopenia   . Ventral hernia     Past Surgical  History  Procedure Laterality Date  . Cerebral aneurysm repair    . Colon surgery    . Cholecystectomy      ileostomyw/ takedown 8/09 Dr Crisoforo [place]  . Abdominal hysterectomy      endometriosis  . Sigmoidoscopy  12/24/2006    rectal adenocarcinoma/Normal retroflexed view of the rectum  . 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 Colonoscopy  09/12/2008     Small rectal pouch, which made retroflexion challenging/ no polyps detected in the rectum/ No diverticula  . Ventral hernia repair    . Colonoscopy  01/21/2012    Procedure: COLONOSCOPY;  Surgeon: Danie Binder, MD;  Location: [facility];  Service: Endoscopy;  Laterality: N/A;     Current Outpatient Prescriptions  Medication Sig Dispense Refill  . citalopram (CELEXA) 20 MG tablet Take 1 tablet by mouth daily.    Marland Kitchen Propylene Glycol (SYSTANE BALANCE) 0.6 % SOLN Apply 2 drops to eye 3 (three) times daily as needed. For dry eyes    . psyllium (METAMUCIL) 58.6 % powder Take 1 packet by mouth daily.    . traZODone (DESYREL) 150 MG tablet Take 150 mg by mouth at bedtime.     No current facility-administered medications for this  visit.    Allergies:   Compazine and Hydromorphone hcl    Social History:  The patient  reports that she quit smoking about 8 years ago. Her smoking use included Cigarettes. She has a 12 pack-year smoking history. She does not have any smokeless tobacco history on file. She reports that she drinks alcohol. She reports that she does not use illicit drugs.   Family History:  The patient's family history includes Diabetes in an other family member; Heart disease in an other family member; Lung disease in an other family member. There is no history of Colon cancer.    ROS:  Please see the history of present  illness.   Otherwise, review of systems are positive for none.   All other systems are reviewed and negative.    PHYSICAL EXAM: VS:  BP 142/84 mmHg  Pulse 51  Ht 5' (1.524 m)  Wt 66.134 kg (145 lb 12.8 oz)  BMI 28.47 kg/m2  PF 4 L/min , BMI Body mass index is 28.47 kg/(m^2). GENERAL:  Well appearing HEENT:  Pupils equal round and reactive, fundi not visualized, oral mucosa unremarkable NECK:  No jugular venous distention, waveform within normal limits, carotid upstroke brisk and symmetric, no bruits, no thyromegaly LYMPHATICS:  No cervical adenopathy LUNGS:  Clear to auscultation bilaterally HEART:  RRR.  PMI not displaced or sustained, nl S1, prominent S2, no S3, no S4, no clicks, no rubs, II/VI systolic murmur at LUSB with radiation to bilateral carotids (longstanding per patient).  ABD:  Flat, positive bowel sounds normal in frequency in pitch, no bruits, no rebound, no guarding, no midline pulsatile mass, no hepatomegaly, no splenomegaly EXT:  2 plus pulses throughout, no edema, no cyanosis no clubbing SKIN:  No rashes no nodules NEURO:  Cranial nerves II through XII grossly intact, motor grossly intact throughout PSYCH:  Cognitively intact, oriented to person place and time    EKG:  EKG is ordered today. The ekg ordered today demonstrates sinus bradycardia at 51 bpm.  LAFB.  Prior anteroseptal infarct.   Recent Labs: No results found for requested labs within last 365 days.    Lipid Panel No results found for: CHOL, TRIG, HDL, CHOLHDL, VLDL, LDLCALC, LDLDIRECT    Wt Readings from Last 3 Encounters:  01/06/15 66.134 kg (145 lb 12.8 oz)  01/21/12 68.04 kg (150 lb)  12/31/11 68.221 kg (150 lb 6.4 oz)      Other studies Reviewed: Additional studies/ records that were reviewed today include: medical record . Review of the above records demonstrates:  Please see elsewhere in the note.     ASSESSMENT AND PLAN:  # Bradycardia: Robin Wolfe has symptomatic bradycardia with  reported episodes of heart rates in the 40s while having symptoms. Her heart rate today is 51. She also seems to have these episodes with exertion, which could be related to  chronotropic incompetence versus ischemia. She will likely need a pacemaker as there are no other clear precipitants for her symptoms. However we will pursue ischemia eval first.  - 24h Holter to document bradycardia and symptoms - Exercise Myoview for chronotropic competence and ischemia eval - CBC, BMP, Mg, coags, lipids, TSH - EP referral for PPM placement  # Hyperlipidemia: Robin Wolfe has been intolerant of statins and has tried intermittent dosing. Her lipids have not been checked in a while and she is not currently on any cholesterol medication. We will check her lipids today and determine management based on these values.   Current medicines  are reviewed at length with the patient today.  The patient does not have concerns regarding medicines.  The following changes have been made:  no change  Labs/ tests ordered today include: 24h Holter, exercise myoview, CBC, chem 7, mg, bmp, coags, TSH  No orders of the defined types were placed in this encounter.     Disposition:   FU with EP within 2 weeks, Dr. Oval Linsey in 1 year   Signed, Sharol Harness, MD  01/06/2015 11:55 AM    [place]

## 2015-01-16 NOTE — Discharge Instructions (Signed)
Radial Site Care Refer to this sheet in the next few weeks. These instructions provide you with information on caring for yourself after your procedure. Your caregiver may also give you more specific instructions. Your treatment has been planned according to current medical practices, but problems sometimes occur. Call your caregiver if you have any problems or questions after your procedure. HOME CARE INSTRUCTIONS  You may shower the day after the procedure.Remove the bandage (dressing) and gently wash the site with plain soap and water.Gently pat the site dry.  Do not apply powder or lotion to the site.  Do not submerge the affected site in water for 3 to 5 days.  Inspect the site at least twice daily.  Do not flex or bend the affected arm for 24 hours.  No lifting over 5 pounds (2.3 kg) for 5 days after your procedure.  Do not drive home if you are discharged the same day of the procedure. Have someone else drive you.  You may drive 24 hours after the procedure unless otherwise instructed by your caregiver.  Do not operate machinery or power tools for 24 hours.  A responsible adult should be with you for the first 24 hours after you arrive home. What to expect:  Any bruising will usually fade within 1 to 2 weeks.  Blood that collects in the tissue (hematoma) may be painful to the touch. It should usually decrease in size and tenderness within 1 to 2 weeks. SEEK IMMEDIATE MEDICAL CARE IF:  You have unusual pain at the radial site.  You have redness, warmth, swelling, or pain at the radial site.  You have drainage (other than a small amount of blood on the dressing).  You have chills.  You have a fever or persistent symptoms for more than 72 hours.  You have a fever and your symptoms suddenly get worse.  Your arm becomes pale, cool, tingly, or numb.  You have heavy bleeding from the site. Hold pressure on the site. Document Released: 06/22/2010 Document Revised:  08/12/2011 Document Reviewed: 06/22/2010 [facility] Patient Information 2015 [place], [place]. This information is not intended to replace advice given to you by your health care provider. Make sure you discuss any questions you have with your health care provider. Return To Work Kitty Cadavid was treated at our facility. INJURY OR ILLNESS WAS: _____ Work-related __x___ Not work-related _____ Undetermined if work-related RETURN TO WORK  Employee may return to work GO:TLXBWIO 17th, 2016  Employee may return to modified work on: ____________________ [place] Work activities not tolerated include: _____ Bending _____ Prolonged sitting _____ Lifting _____ Squatting _____ Prolonged standing _____ Lesle Reek _____ Reaching _____ Pushing and pulling _____ Walking _____ Other ____________________ Show this Return to Work statement to Optician, dispensing at work as soon as possible. Your employer should be aware of your condition and can help with the necessary work activity restrictions. If you wish to return to work sooner than the date above, or if you have further problems which make it difficult for you to return at that time, please call us or your caregiver. _________________________________________ Physician Name (Printed) _________________________________________ Physician Signature  _________________________________________ Date Document Released: 05/20/2005 Document Revised: 08/12/2011 Document Reviewed: 11/04/2006 [employer] Patient Information 2015 [place], [place]. This information is not intended to replace advice given to you by your health care provider. Make sure you discuss any questions you have with your health care provider.

## 2015-01-16 NOTE — Interval H&P Note (Signed)
History and Physical Interval Note:  01/16/2015 8:59 AM  Robin Wolfe  has presented today for cardiac cath with the diagnosis of abnormal stress test, concern for obstructive CAD.  The various methods of treatment have been discussed with the patient and family. After consideration of risks, benefits and other options for treatment, the patient has consented to  Procedure(s): Left Heart Cath and Coronary Angiography (N/A) as a surgical intervention .  The patient's history has been reviewed, patient examined, no change in status, stable for surgery.  I have reviewed the patient's chart and labs.  Questions were answered to the patient's satisfaction.    Cath Lab Visit (complete for each Cath Lab visit)  Clinical Evaluation Leading to the Procedure:   ACS: No.  Non-ACS:    Anginal Classification: CCS II  Anti-ischemic medical therapy: No Therapy  Non-Invasive Test Results: Low-risk stress test findings: cardiac mortality <1%/year  Prior CABG: No previous CABG        MCALHANY,CHRISTOPHER

## 2015-01-17 MED FILL — Lidocaine HCl Local Preservative Free (PF) Inj 1%: INTRAMUSCULAR | Qty: 30 | Status: AC

## 2015-01-17 MED FILL — Heparin Sodium (Porcine) 2 Unit/ML in Sodium Chloride 0.9%: INTRAMUSCULAR | Qty: 1000 | Status: AC

## 2015-01-18 ENCOUNTER — Telehealth: Payer: Self-pay | Admitting: Cardiovascular Disease

## 2015-01-18 NOTE — Telephone Encounter (Signed)
Pt called in wanting to know if Dr. Oval Linsey had made a decision on if she needed to have a pace maker or not. She is concerned because her heart rate is down to 42 and 44. Please f/u with her   Thanks

## 2015-01-18 NOTE — Telephone Encounter (Signed)
Patient calling wondering if Dr. Oval Linsey has made a decision about her needing a pacemaker or not since it was found she did not have a blockage.  For the last three months her heart rate will dip to 40-44 with dizziness.  Episode last for approximately 30 minutes relieved by sitting or lying down.  I asked her to take blood pressure and pulse while she was sitting there:  Results = B/P 138/71 P 44 and she said that she feel dizzy.  No different or new medications  Will route to Dr. Oval Linsey,  If no response I will ask DOD

## 2015-01-18 NOTE — Telephone Encounter (Signed)
Please reschedule Robin Wolfe a consultation with EP for consideration of a pacemaker.

## 2015-01-18 NOTE — Telephone Encounter (Signed)
Have not heard back from Dr. Oval Linsey will route to Dr. [place] DOD

## 2015-01-20 NOTE — Telephone Encounter (Signed)
Called patient about Dr. Blenda Mounts plan for EP evaluation for pacemaker. LVMTCB  Routed to [facility] admin to schedule appointment for EP evaluation for pacemaker

## 2015-01-30 ENCOUNTER — Ambulatory Visit (INDEPENDENT_AMBULATORY_CARE_PROVIDER_SITE_OTHER)
Admission: RE | Admit: 2015-01-30 | Discharge: 2015-01-30 | Disposition: A | Discharge status: Home / Self Care | Payer: 59 | Source: Ambulatory Visit | Attending: Internal Medicine | Admitting: Internal Medicine

## 2015-01-30 ENCOUNTER — Ambulatory Visit (INDEPENDENT_AMBULATORY_CARE_PROVIDER_SITE_OTHER): Payer: 59 | Admitting: Internal Medicine

## 2015-01-30 ENCOUNTER — Encounter: Payer: Self-pay | Admitting: Internal Medicine

## 2015-01-30 VITALS — BP 138/74 | HR 48 | Ht 64.0 in | Wt 144.8 lb

## 2015-01-30 DIAGNOSIS — Z8679 Personal history of other diseases of the circulatory system: Secondary | ICD-10-CM | POA: Diagnosis not present

## 2015-01-30 DIAGNOSIS — Z01812 Encounter for preprocedural laboratory examination: Secondary | ICD-10-CM

## 2015-01-30 LAB — CBC WITH DIFFERENTIAL/PLATELET
BASOS ABS: 0 10*3/uL (ref 0.0–0.1)
BASOS PCT: 0.6 % (ref 0.0–3.0)
Eosinophils Absolute: 0.1 10*3/uL (ref 0.0–0.7)
Eosinophils Relative: 1.8 % (ref 0.0–5.0)
HEMATOCRIT: 37.8 % (ref 36.0–46.0)
Hemoglobin: 12.6 g/dL (ref 12.0–15.0)
LYMPHS PCT: 29.1 % (ref 12.0–46.0)
Lymphs Abs: 1 10*3/uL (ref 0.7–4.0)
MCHC: 33.3 g/dL (ref 30.0–36.0)
MCV: 90.2 fl (ref 78.0–100.0)
MONOS PCT: 8 % (ref 3.0–12.0)
Monocytes Absolute: 0.3 10*3/uL (ref 0.1–1.0)
NEUTROS ABS: 2 10*3/uL (ref 1.4–7.7)
Neutrophils Relative %: 60.5 % (ref 43.0–77.0)
RBC: 4.19 Mil/uL (ref 3.87–5.11)
RDW: 15.3 % (ref 11.5–15.5)
WBC: 3.3 10*3/uL — ABNORMAL LOW (ref 4.0–10.5)

## 2015-01-30 LAB — PROTIME-INR
INR: 1.1 ratio — ABNORMAL HIGH (ref 0.8–1.0)
Prothrombin Time: 12.3 s (ref 9.6–13.1)

## 2015-01-30 LAB — BASIC METABOLIC PANEL
BUN: 13 mg/dL (ref 6–23)
CALCIUM: 9.5 mg/dL (ref 8.4–10.5)
CHLORIDE: 103 meq/L (ref 96–112)
CO2: 30 meq/L (ref 19–32)
Creatinine, Ser: 0.87 mg/dL (ref 0.40–1.20)
GFR: 71.19 mL/min (ref >60.00)
Glucose, Bld: 97 mg/dL (ref 70–99)
Potassium: 3.5 mEq/L (ref 3.5–5.1)
SODIUM: 139 meq/L (ref 135–145)

## 2015-01-30 MED ORDER — IOHEXOL 300 MG/ML  SOLN
80.0000 mL | Freq: Once | INTRAMUSCULAR | Status: AC | PRN
  Administered 2015-01-30: 80 mL via INTRAVENOUS

## 2015-01-30 NOTE — Patient Instructions (Signed)
Medication Instructions:  Your physician recommends that you continue on your current medications as directed. Please refer to the Current Medication list given to you today.   Labwork: Your physician recommends that you return for lab work in: (bmet, cbc, inr/pt)   Testing/Procedures: Non-Cardiac CT scanning, (CAT scanning), is a noninvasive, special x-ray that produces cross-sectional images of the body using x-rays and a computer. CT scans help physicians diagnose and treat medical conditions. For some CT exams, a contrast material is used to enhance visibility in the area of the body being studied. CT scans provide greater clarity and reveal more details than regular x-ray exams.    Follow-Up: Your physician recommends that you schedule a follow-up appointment in: [facility] 10-12 days from 8/7  Your physician wants you to follow-up in: 3 months with Dr.Klein.  You will receive a reminder letter in the mail two months in advance. If you don't receive a letter, please call our office to schedule the follow-up appointment.   Any Other Special Instructions Will Be Listed Below (If Applicable).

## 2015-01-30 NOTE — Progress Notes (Signed)
ELECTROPHYSIOLOGY CONSULT NOTE  Patient ID: Robin Wolfe, MRN: [id number], DOB/AGE: Mar 24, 1956 59 y.o. Admit date: (Not on file) Date of Consult: 01/30/2015  Primary Physician: Delphina Cahill, MD Primary Cardiologist:     Chief Complaint: bradycardia and weakness   HPI Robin Wolfe is a 59 y.o. female  Referred for consideration of pacing.  She has a long-standing history of bradycardia. Heart rates typically run in the low 50s. Over recent months she has noted her heart rates in the 40s. This is been associated with some degree of hypotension. 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 When her heart rate returned into the 50s she feels better.  Cardiac evaluation has included Myoview stress testing. Peak heart rate during stress testing was 137. Following exercise she has significant postevent bradycardia and hypotension. She has a history of shower intolerance, heat intolerance as well as orthostatic intolerance.  Because of the abnormal Myoview response, she underwent catheterization demonstrated normal LV function and nonobstructive coronary disease.  Evaluation also included TSH which is normal CBC which was normal apart from mild chronic neutropenia and thrombocytopenia  Her past medical history is notable for ruptured cerebral aneurysm prompting clipping about 15 years ago as well as colon cancer requiring resection, radiation and chemotherapy about 8 years ago.       Past Medical History  Diagnosis Date  . Hypercholesteremia   . 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 Insomnia   . Thrombocytopenia   . Ventral hernia       Surgical History:  Past Surgical History  Procedure Laterality Date  . Cerebral aneurysm repair    . Colon surgery    . Cholecystectomy      ileostomyw/ takedown 8/09 Dr Crisoforo [place]  . Abdominal hysterectomy      endometriosis   . Sigmoidoscopy  12/24/2006    rectal adenocarcinoma/Normal retroflexed view of the rectum  . 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 Colonoscopy  09/12/2008     Small rectal pouch, which made retroflexion challenging/ no polyps detected in the rectum/ No diverticula  . Ventral hernia repair    . Colonoscopy  01/21/2012    Procedure: COLONOSCOPY;  Surgeon: Danie Binder, MD;  [facility];  Service: Endoscopy;  Laterality: N/A;  . Cardiac catheterization N/A 01/16/2015    Procedure: Left Heart Cath and Coronary Angiography;  Surgeon: Burnell Blanks, MD;  Location: [facility];  Service: Cardiovascular;  Laterality: N/A;     Home Meds: Prior to Admission medications   Medication Sig Start Date End Date Taking? Authorizing Provider  citalopram (CELEXA) 20 MG tablet Take 1 tablet by mouth daily. 12/27/14  Yes Historical Provider, MD  ibuprofen (ADVIL,MOTRIN) 200 MG tablet Take 800 mg by mouth every 8 (eight) hours as needed for mild pain or moderate pain.   Yes Historical Provider, MD  Propylene Glycol (SYSTANE BALANCE) 0.6 % SOLN Apply 2 drops to eye 3 (three) times daily as needed. For dry eyes   Yes Historical Provider, MD  psyllium (METAMUCIL) 58.6 % powder Take 1 packet by mouth daily.   Yes Historical Provider, MD  traZODone (DESYREL) 150 MG tablet  Take 150 mg by mouth at bedtime.   Yes Historical Provider, MD     Allergies:  Allergies  Allergen Reactions  . Compazine Other (See Comments)    Reaction: seizures  . Hydromorphone Hcl Other (See Comments)    REACTION: hallucinations and behavior issues    Social History   Social History  . Marital Status: Married    Spouse Name: N/A  . Number of Children: N/A  . Years of Education: N/A    Occupational History  . Not on file.   Social History Main Topics  . Smoking status: Former Smoker -- 1.50 packs/day for 8 years    Types: Cigarettes    Quit date: 01/06/2007  . Smokeless tobacco: Not on file  . Alcohol Use: 0.0 oz/week    0 Standard drinks or equivalent per week     Comment: rarely  . Drug Use: No  . Sexual Activity: Yes    Birth Control/ Protection: Surgical   Other Topics Concern  . Not on file   Social History Narrative     Family History  Problem Relation Age of Onset  . Heart disease    . Lung disease    . Diabetes    . Colon cancer Neg Hx      ROS:  Please see the history of present illness.   The daily  All other systems reviewed and negative.    Physical Exam:   Blood pressure 122/78, pulse 46, height 5\' 4"  (1.626 m), weight 144 lb 12.8 oz (65.681 kg). General: Well developed, well nourished female in no acute distress. Head: Normocephalic, atraumatic, sclera non-icteric, no xanthomas, nares are without discharge. EENT: normal Lymph Nodes:  none Back: without scoliosis/kyphosis*, no CVA tendersness Neck: Negative for carotid bruits. JVD not elevated. Lungs: Clear bilaterally to auscultation without wheezes, rales, or rhonchi. Breathing is unlabored. Heart: RRR with S1 S2. No murmur , rubs, or gallops appreciated. Abdomen: Soft, non-tender, non-distended with normoactive bowel sounds. No hepatomegaly. No rebound/guarding. No obvious abdominal masses. Msk:  Strength and tone appear normal for age. Extremities: No clubbing or cyanosis. No edema.  Distal pedal pulses are 2+ and equal bilaterally. Skin: Warm and Dry Neuro: Alert and oriented X 3. CN III-XII intact Grossly normal sensory and motor function . Psych:  Responds to questions appropriately with a normal affect.      Labs: Cardiac Enzymes No results for input(s): CKTOTAL, CKMB, TROPONINI in the last 72 hours. CBC Lab Results  Component Value Date   WBC 3.3* 01/08/2015   HGB  13.2 01/08/2015   HCT 39.4 01/08/2015   MCV 91.2 01/08/2015   PLT 88* 01/08/2015   PROTIME: No results for input(s): LABPROT, INR in the last 72 hours. Chemistry No results for input(s): NA, K, CL, CO2, BUN, CREATININE, CALCIUM, PROT, BILITOT, ALKPHOS, ALT, AST, GLUCOSE in the last 168 hours.  Invalid input(s): LABALBU Lipids Lab Results  Component Value Date   CHOL 238* 01/08/2015   HDL 38* 01/08/2015   LDLCALC 170* 01/08/2015   TRIG 148 01/08/2015   BNP No results found for: PROBNP Thyroid Function Tests: No results for input(s): TSH, T4TOTAL, T3FREE, THYROIDAB in the last 72 hours.  Invalid input(s): FREET3    Miscellaneous No results found for: DDIMER  Radiology/Studies:  No results found.  EKG: Sinus rhythm at 64 intervals 23/09/39    Assessment and Plan:   Symptomatic sinus brady  The patient has symptoms and  sinus bradycardia. Interestingly, in the post exercise phase  of her treadmill which was associated notably with chronotropic competence, she had evidence of autonomic insufficiency with bradycardia and hypotension. She also has shower intolerance heat intolerance and orthostatic intolerance raising the specter of autonomic insufficiency aggravating and or contributing to her symptomatic sinus bradycardia.  She has looked exhaustively at herself and notes that her heart rates in the 40s are almost always symptomatic and when she is in the 35s they are not. I am somewhat surprised about the degree of symptoms associated with this   change in heart rate but it's hard to argue with her observations and the heart rate difference is almost 20%.  Given the fact that she has a history of a cerebral aneurysm requiring clipping, we would plan to undertake a CT scan first to make sure there is nothing central. I don't think her colon cancer is likely contributing although it can be associated following resection with autonomic insufficiency..  Given her history of  aneurysms, we would undertake the use of an MRI compatible device and with the autonomic question we would use a [employer] CLS system        [employer]

## 2015-02-02 NOTE — Telephone Encounter (Signed)
Pt has been scheduled with Dr. Caryl Comes on 12/13

## 2015-02-08 ENCOUNTER — Encounter (HOSPITAL_COMMUNITY)
Admission: RE | Disposition: A | Discharge status: Home / Self Care | Payer: 59 | Source: Ambulatory Visit | Attending: Internal Medicine

## 2015-02-08 ENCOUNTER — Ambulatory Visit (HOSPITAL_COMMUNITY)
Admission: RE | Admit: 2015-02-08 | Discharge: 2015-02-09 | Disposition: A | Discharge status: Home / Self Care | Payer: 59 | Source: Ambulatory Visit | Attending: Internal Medicine | Admitting: Internal Medicine

## 2015-02-08 DIAGNOSIS — D696 Thrombocytopenia, unspecified: Secondary | ICD-10-CM | POA: Insufficient documentation

## 2015-02-08 DIAGNOSIS — R001 Bradycardia, unspecified: Secondary | ICD-10-CM | POA: Diagnosis present

## 2015-02-08 DIAGNOSIS — Z87891 Personal history of nicotine dependence: Secondary | ICD-10-CM | POA: Diagnosis not present

## 2015-02-08 DIAGNOSIS — R55 Syncope and collapse: Secondary | ICD-10-CM | POA: Diagnosis not present

## 2015-02-08 DIAGNOSIS — I495 Sick sinus syndrome: Secondary | ICD-10-CM | POA: Diagnosis not present

## 2015-02-08 DIAGNOSIS — Z950 Presence of cardiac pacemaker: Secondary | ICD-10-CM | POA: Diagnosis not present

## 2015-02-08 DIAGNOSIS — Z959 Presence of cardiac and vascular implant and graft, unspecified: Secondary | ICD-10-CM

## 2015-02-08 HISTORY — DX: Bradycardia, unspecified: R00.1

## 2015-02-08 HISTORY — DX: Presence of cardiac pacemaker: Z95.0

## 2015-02-08 HISTORY — PX: EP IMPLANTABLE DEVICE: SHX172B

## 2015-02-08 LAB — SURGICAL PCR SCREEN
MRSA, PCR: NEGATIVE
Staphylococcus aureus: NEGATIVE

## 2015-02-08 SURGERY — PACEMAKER IMPLANT

## 2015-02-08 MED ORDER — ACETAMINOPHEN 325 MG PO TABS
325.0000 mg | ORAL_TABLET | ORAL | Status: DC | PRN
  Filled 2015-02-08: qty 2

## 2015-02-08 MED ORDER — MIDAZOLAM HCL 5 MG/5ML IJ SOLN
INTRAMUSCULAR | Status: AC
  Filled 2015-02-08: qty 25

## 2015-02-08 MED ORDER — SODIUM CHLORIDE 0.9 % IV SOLN
INTRAVENOUS | Status: DC
Start: 2015-02-08 — End: 2015-02-08
  Administered 2015-02-08: 12:00:00 via INTRAVENOUS

## 2015-02-08 MED ORDER — CEFAZOLIN SODIUM-DEXTROSE 2-3 GM-% IV SOLR
INTRAVENOUS | Status: AC
  Filled 2015-02-08: qty 50

## 2015-02-08 MED ORDER — MUPIROCIN 2 % EX OINT
TOPICAL_OINTMENT | CUTANEOUS | Status: AC
  Administered 2015-02-08: 1 via TOPICAL
  Filled 2015-02-08: qty 22

## 2015-02-08 MED ORDER — SODIUM CHLORIDE 0.9 % IJ SOLN
3.0000 mL | Freq: Two times a day (BID) | INTRAMUSCULAR | Status: DC
  Administered 2015-02-08: 10 mL via INTRAVENOUS

## 2015-02-08 MED ORDER — SODIUM CHLORIDE 0.9 % IV SOLN: INTRAVENOUS | Status: DC

## 2015-02-08 MED ORDER — CEFAZOLIN SODIUM 1-5 GM-% IV SOLN
1.0000 g | Freq: Four times a day (QID) | INTRAVENOUS | Status: AC
  Administered 2015-02-08 – 2015-02-09 (×3): 1 g via INTRAVENOUS
  Filled 2015-02-08 (×3): qty 50

## 2015-02-08 MED ORDER — SODIUM CHLORIDE 0.9 % IV SOLN
INTRAVENOUS | Status: DC
  Administered 2015-02-08: 12:00:00 via INTRAVENOUS

## 2015-02-08 MED ORDER — YOU HAVE A PACEMAKER BOOK
Freq: Once | Status: AC
  Administered 2015-02-08: 17:00:00
  Filled 2015-02-08: qty 1

## 2015-02-08 MED ORDER — LIDOCAINE HCL (PF) 1 % IJ SOLN
INTRAMUSCULAR | Status: AC
Start: 2015-02-08 — End: 2015-02-08
  Filled 2015-02-08: qty 60

## 2015-02-08 MED ORDER — SODIUM CHLORIDE 0.9 % IR SOLN
Status: AC
  Filled 2015-02-08: qty 2

## 2015-02-08 MED ORDER — ONDANSETRON HCL 4 MG/2ML IJ SOLN: 4.0000 mg | Freq: Four times a day (QID) | INTRAMUSCULAR | Status: DC | PRN

## 2015-02-08 MED ORDER — CITALOPRAM HYDROBROMIDE 20 MG PO TABS
20.0000 mg | ORAL_TABLET | Freq: Every day | ORAL | Status: DC
  Filled 2015-02-08 (×2): qty 1

## 2015-02-08 MED ORDER — MUPIROCIN 2 % EX OINT
1.0000 "application " | TOPICAL_OINTMENT | Freq: Once | CUTANEOUS | Status: AC
  Administered 2015-02-08: 1 via TOPICAL

## 2015-02-08 MED ORDER — TRAZODONE HCL 50 MG PO TABS
150.0000 mg | ORAL_TABLET | Freq: Every day | ORAL | Status: DC
  Filled 2015-02-08: qty 3

## 2015-02-08 MED ORDER — FENTANYL CITRATE (PF) 100 MCG/2ML IJ SOLN
INTRAMUSCULAR | Status: AC
Start: 2015-02-08 — End: 2015-02-08
  Filled 2015-02-08: qty 4

## 2015-02-08 MED ORDER — CEFAZOLIN SODIUM-DEXTROSE 2-3 GM-% IV SOLR: 2.0000 g | INTRAVENOUS | Status: DC

## 2015-02-08 MED ORDER — IBUPROFEN 200 MG PO TABS
800.0000 mg | ORAL_TABLET | Freq: Three times a day (TID) | ORAL | Status: DC | PRN
  Administered 2015-02-08 – 2015-02-09 (×2): 800 mg via ORAL
  Filled 2015-02-08 (×2): qty 4

## 2015-02-08 MED ORDER — FENTANYL CITRATE (PF) 100 MCG/2ML IJ SOLN
INTRAMUSCULAR | Status: DC | PRN
  Administered 2015-02-08 (×3): 25 ug via INTRAVENOUS

## 2015-02-08 MED ORDER — MIDAZOLAM HCL 5 MG/5ML IJ SOLN
INTRAMUSCULAR | Status: DC | PRN
  Administered 2015-02-08: 1 mg via INTRAVENOUS
  Administered 2015-02-08 (×2): 2 mg via INTRAVENOUS

## 2015-02-08 MED ORDER — SODIUM CHLORIDE 0.9 % IR SOLN: 80.0000 mg | Status: DC

## 2015-02-08 MED ORDER — CHLORHEXIDINE GLUCONATE 4 % EX LIQD: 60.0000 mL | Freq: Once | CUTANEOUS | Status: DC

## 2015-02-08 MED ORDER — CEFAZOLIN SODIUM-DEXTROSE 2-3 GM-% IV SOLR
INTRAVENOUS | Status: DC | PRN
  Administered 2015-02-08: 2 g via INTRAVENOUS

## 2015-02-08 SURGICAL SUPPLY — 8 items
CABLE SURGICAL S-101-97-12 (CABLE) ×1 IMPLANT
HEMOSTAT SURGICEL 2X4 FIBR (HEMOSTASIS) ×1 IMPLANT
LEAD CAPSURE NOVUS 45CM (Lead) ×1 IMPLANT
LEAD CAPSURE NOVUS 5076-52CM (Lead) ×1 IMPLANT
PPM ELUNA DR-T 394969 (Pacemaker) ×1 IMPLANT
SHEATH CLASSIC 7F (SHEATH) ×2 IMPLANT
SHIELD RADPAD SCOOP 12X17 (MISCELLANEOUS) IMPLANT
TRAY PACEMAKER INSERTION (CUSTOM PROCEDURE TRAY) ×1 IMPLANT

## 2015-02-08 NOTE — CV Procedure (Signed)
+  Preop DX::sinus node dysfunction presyncope   Post op DX:: same    Procedure  dual pacemaker implantation  After routine prep and drape, lidocaine was infiltrated in the prepectoral subclavicular region on the left side an incision was made and carried down to later the prepectoral fascia using electrocautery and sharp dissection a pocket was formed similarly. Hemostasis was obtained.  After this, we turned our attention to gaining accessm to the extrathoracic,left subclavian vein. This was accomplished without difficulty and without the aspiration of air or puncture of the artery. 2 separate venipunctures were accomplished; guidewires were placed and retained and sequentially 7 French sheaths were placed through which were passed a Medtronic MRI compatible 5076 ventricular lead serial [id number] and a  Medtronic MRI compatible  atrial lead serial number [id number] The ventricular lead was manipulated to the right ventricular apex with a bipolar R wave was  8.2 the pacing impedance was 565, the threshold was 0.4 @ 0.4 msec  Current at threshold was   1.0  Ma and the current of injury was  BRISK.  The right atrial lead was manipulated to the right atrial appendage with a bipolar P-wave 4.0 the pacing impedance was 468, the threshold 1.0@ 0.4 msec   Current at threshold was 2.5  Ma and the current of injury was  BRISK.  The leads were affixed to the prepectoral fascia and attached to a  BIOTRONIK MIR COMPATIBILE  pulse generator serial number [id number].  Hemostasis was obtained. The pocket was copiously irrigated with antibiotic containing saline solution. The leads and the pulse generator were placed in the pocket and affixed to the prepectoral fascia. The wound was then closed in 2 layers in the normal fashion.  The wound was washed dried . And a dermabond adhesive was applied  Needle  Count, sponge counts and instrument counts were correct at the end of the procedure .  The patient tolerated  the procedure without apparent complication.  EBL Minimal    Maryagnes Amos.D.

## 2015-02-08 NOTE — Interval H&P Note (Signed)
History and Physical Interval Note:  02/08/2015 12:33 PM  Robin Wolfe  has presented today for surgery, with the diagnosis of bradicardia  The various methods of treatment have been discussed with the patient and family. After consideration of risks, benefits and other options for treatment, the patient has consented to  Procedure(s): Pacemaker Implant (N/A) as a surgical intervention .  The patient's history has been reviewed, patient examined, no change in status, stable for surgery.  I have reviewed the patient's chart and labs.  Questions were answered to the patient's satisfaction.     Virl Axe

## 2015-02-08 NOTE — H&P (View-Only) (Signed)
  ELECTROPHYSIOLOGY CONSULT NOTE  Patient ID: Robin Wolfe, MRN: [id number], DOB/AGE: 02/01/1956 59 y.o. Admit date: (Not on file) Date of Consult: 01/30/2015  Primary Physician: HALL, ZACH, MD Primary Cardiologist:     Chief Complaint: bradycardia and weakness   HPI Robin Wolfe is a 59 y.o. female  Referred for consideration of pacing.  She has a long-standing history of bradycardia. Heart rates typically run in the low 50s. Over recent months she has noted her heart rates in the 40s. This is been associated with some degree of hypotension. 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 When her heart rate returned into the 50s she feels better.  Cardiac evaluation has included Myoview stress testing. Peak heart rate during stress testing was 137. Following exercise she has significant postevent bradycardia and hypotension. She has a history of shower intolerance, heat intolerance as well as orthostatic intolerance.  Because of the abnormal Myoview response, she underwent catheterization demonstrated normal LV function and nonobstructive coronary disease.  Evaluation also included TSH which is normal CBC which was normal apart from mild chronic neutropenia and thrombocytopenia  Her past medical history is notable for ruptured cerebral aneurysm prompting clipping about 15 years ago as well as colon cancer requiring resection, radiation and chemotherapy about 8 years ago.       Past Medical History  Diagnosis Date  . Hypercholesteremia   . 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 Insomnia   . Thrombocytopenia   . Ventral hernia       Surgical History:  Past Surgical History  Procedure Laterality Date  . Cerebral aneurysm repair    . Colon surgery    . Cholecystectomy      ileostomyw/ takedown 8/09 Dr Shen  . Abdominal hysterectomy      endometriosis   . Sigmoidoscopy  12/24/2006    rectal adenocarcinoma/Normal retroflexed view of the rectum  . 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 Colonoscopy  09/12/2008     Small rectal pouch, which made retroflexion challenging/ no polyps detected in the rectum/ No diverticula  . Ventral hernia repair    . Colonoscopy  01/21/2012    Procedure: COLONOSCOPY;  Surgeon: Sandi L Fields, MD;  Location: [facility];  Service: Endoscopy;  Laterality: N/A;  . Cardiac catheterization N/A 01/16/2015    Procedure: Left Heart Cath and Coronary Angiography;  Surgeon: Christopher D McAlhany, MD;  Location: [facility]VAS[facility];  Service: Cardiovascular;  Laterality: N/A;     Home Meds: Prior to Admission medications   Medication Sig Start Date End Date Taking? Authorizing Provider  citalopram (CELEXA) 20 MG tablet Take 1 tablet by mouth daily. 12/27/14  Yes Historical Provider, MD  ibuprofen (ADVIL,MOTRIN) 200 MG tablet Take 800 mg by mouth every 8 (eight) hours as needed for mild pain or moderate pain.   Yes Historical Provider, MD  Propylene Glycol (SYSTANE BALANCE) 0.6 % SOLN Apply 2 drops to eye 3 (three) times daily as needed. For dry eyes   Yes Historical Provider, MD  psyllium (METAMUCIL) 58.6 % powder Take 1 packet by mouth daily.   Yes Historical Provider, MD  traZODone (DESYREL) 150 MG tablet   Take 150 mg by mouth at bedtime.   Yes Historical Provider, MD     Allergies:  Allergies  Allergen Reactions  . Compazine Other (See Comments)    Reaction: seizures  . Hydromorphone Hcl Other (See Comments)    REACTION: hallucinations and behavior issues    Social History   Social History  . Marital Status: Married    Spouse Name: N/A  . Number of Children: N/A  . Years of Education: N/A    Occupational History  . Not on file.   Social History Main Topics  . Smoking status: Former Smoker -- 1.50 packs/day for 8 years    Types: Cigarettes    Quit date: 01/06/2007  . Smokeless tobacco: Not on file  . Alcohol Use: 0.0 oz/week    0 Standard drinks or equivalent per week     Comment: rarely  . Drug Use: No  . Sexual Activity: Yes    Birth Control/ Protection: Surgical   Other Topics Concern  . Not on file   Social History Narrative     Family History  Problem Relation Age of Onset  . Heart disease    . Lung disease    . Diabetes    . Colon cancer Neg Hx      ROS:  Please see the history of present illness.   The daily  All other systems reviewed and negative.    Physical Exam:   Blood pressure 122/78, pulse 46, height 5' 4" (1.626 m), weight 144 lb 12.8 oz (65.681 kg). General: Well developed, well nourished female in no acute distress. Head: Normocephalic, atraumatic, sclera non-icteric, no xanthomas, nares are without discharge. EENT: normal Lymph Nodes:  none Back: without scoliosis/kyphosis*, no CVA tendersness Neck: Negative for carotid bruits. JVD not elevated. Lungs: Clear bilaterally to auscultation without wheezes, rales, or rhonchi. Breathing is unlabored. Heart: RRR with S1 S2. No murmur , rubs, or gallops appreciated. Abdomen: Soft, non-tender, non-distended with normoactive bowel sounds. No hepatomegaly. No rebound/guarding. No obvious abdominal masses. Msk:  Strength and tone appear normal for age. Extremities: No clubbing or cyanosis. No edema.  Distal pedal pulses are 2+ and equal bilaterally. Skin: Warm and Dry Neuro: Alert and oriented X 3. CN III-XII intact Grossly normal sensory and motor function . Psych:  Responds to questions appropriately with a normal affect.      Labs: Cardiac Enzymes No results for input(s): CKTOTAL, CKMB, TROPONINI in the last 72 hours. CBC Lab Results  Component Value Date   WBC 3.3* 01/08/2015   HGB  13.2 01/08/2015   HCT 39.4 01/08/2015   MCV 91.2 01/08/2015   PLT 88* 01/08/2015   PROTIME: No results for input(s): LABPROT, INR in the last 72 hours. Chemistry No results for input(s): NA, K, CL, CO2, BUN, CREATININE, CALCIUM, PROT, BILITOT, ALKPHOS, ALT, AST, GLUCOSE in the last 168 hours.  Invalid input(s): LABALBU Lipids Lab Results  Component Value Date   CHOL 238* 01/08/2015   HDL 38* 01/08/2015   LDLCALC 170* 01/08/2015   TRIG 148 01/08/2015   BNP No results found for: PROBNP Thyroid Function Tests: No results for input(s): TSH, T4TOTAL, T3FREE, THYROIDAB in the last 72 hours.  Invalid input(s): FREET3    Miscellaneous No results found for: DDIMER  Radiology/Studies:  No results found.  EKG: Sinus rhythm at 64 intervals 23/09/39    Assessment and Plan:   Symptomatic sinus brady  The patient has symptoms and  sinus bradycardia. Interestingly, in the post exercise phase   of her treadmill which was associated notably with chronotropic competence, she had evidence of autonomic insufficiency with bradycardia and hypotension. She also has shower intolerance heat intolerance and orthostatic intolerance raising the specter of autonomic insufficiency aggravating and or contributing to her symptomatic sinus bradycardia.  She has looked exhaustively at herself and notes that her heart rates in the 40s are almost always symptomatic and when she is in the 50s they are not. I am somewhat surprised about the degree of symptoms associated with this   change in heart rate but it's hard to argue with her observations and the heart rate difference is almost 20%.  Given the fact that she has a history of a cerebral aneurysm requiring clipping, we would plan to undertake a CT scan first to make sure there is nothing central. I don't think her colon cancer is likely contributing although it can be associated following resection with autonomic insufficiency..  Given her history of  aneurysms, we would undertake the use of an MRI compatible device and with the autonomic question we would use a Biotronik CLS system        Steven Klein   

## 2015-02-08 NOTE — Progress Notes (Signed)
Pt has used bsc 6x since arrival to floor a little over an hour ago.  Pt states not unusual since her rectal cancer when something upsets her stomach.  Has small amounts loose brown stool each time.  VSS.  Discussed with pt the MD order for SCD's.  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  LUC drsg has tiny spot of blood, area is soft and flat. Lungs clear.  Ibuprofen given for LUC soreness 3/10.  Pt's mother is at bedside and very helpful and supportive.  Tele shows a-paced 55-62.  Call light in reach.

## 2015-02-09 ENCOUNTER — Ambulatory Visit (HOSPITAL_COMMUNITY): Payer: 59

## 2015-02-09 ENCOUNTER — Encounter (HOSPITAL_COMMUNITY): Payer: Self-pay | Admitting: *Deleted

## 2015-02-09 DIAGNOSIS — R001 Bradycardia, unspecified: Secondary | ICD-10-CM

## 2015-02-09 DIAGNOSIS — I495 Sick sinus syndrome: Secondary | ICD-10-CM

## 2015-02-09 DIAGNOSIS — Z950 Presence of cardiac pacemaker: Secondary | ICD-10-CM

## 2015-02-09 DIAGNOSIS — R55 Syncope and collapse: Secondary | ICD-10-CM | POA: Diagnosis not present

## 2015-02-09 DIAGNOSIS — D696 Thrombocytopenia, unspecified: Secondary | ICD-10-CM | POA: Diagnosis not present

## 2015-02-09 DIAGNOSIS — Z87891 Personal history of nicotine dependence: Secondary | ICD-10-CM | POA: Diagnosis not present

## 2015-02-09 HISTORY — DX: Presence of cardiac pacemaker: Z95.0

## 2015-02-09 HISTORY — DX: Bradycardia, unspecified: R00.1

## 2015-02-09 MED ORDER — ACETAMINOPHEN 325 MG PO TABS: 325.0000 mg | ORAL_TABLET | ORAL | Status: DC | PRN

## 2015-02-09 MED FILL — Lidocaine HCl Local Preservative Free (PF) Inj 1%: INTRAMUSCULAR | Qty: 60 | Status: AC

## 2015-02-09 MED FILL — Gentamicin Sulfate Inj 40 MG/ML: INTRAMUSCULAR | Qty: 2 | Status: AC

## 2015-02-09 MED FILL — Sodium Chloride Irrigation Soln 0.9%: Qty: 500 | Status: AC

## 2015-02-09 NOTE — Discharge Instructions (Signed)
° ° °  Supplemental Discharge Instructions for  Pacemaker Patients  Activity No heavy lifting or vigorous activity with your left arm for 6 to 8 weeks.  Do not raise your left arm above your head for one week.  Gradually raise your affected arm as drawn below.           __ 02/09/15                           02/11/15                         02/13/15                02/15/15   NO DRIVING for  About 1 week    ; you may begin driving on 4/70/76    .  WOUND CARE - Keep the wound area clean and dry.  Do not get this area wet for one week. No showers for one week; you may shower on 02/14/15    . - The tape/steri-strips on your wound will fall off; do not pull them off.  No bandage is needed on the site.  DO  NOT apply any creams, oils, or ointments to the wound area. - If you notice any drainage or discharge from the wound, any swelling or bruising at the site, or you develop a fever > 101? F after you are discharged home, call the office at once.  Special Instructions - You are still able to use cellular telephones; use the ear opposite the side where you have your pacemaker/defibrillator.  Avoid carrying your cellular phone near your device. - When traveling through airports, show security personnel your identification card to avoid being screened in the metal detectors.  Ask the security personnel to use the hand wand. - Avoid arc welding equipment, MRI testing (magnetic resonance imaging), TENS units (transcutaneous nerve stimulators).  Call the office for questions about other devices. - Avoid electrical appliances that are in poor condition or are not properly grounded. - Microwave ovens are safe to be near or to operate.  Heart Healthy Diet.  Hold off of Ibuprofen for a week to allow healing of pacer site.   May return to work on Monday. 02/13/15

## 2015-02-09 NOTE — Progress Notes (Signed)
        Patient Name: Robin Wolfe      SUBJECTIVE:* without complaint  Past Medical History  Diagnosis Date  . Hypercholesteremia   . 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 Insomnia   . Thrombocytopenia   . Ventral hernia     Scheduled Meds:  Scheduled Meds: .  ceFAZolin (ANCEF) IV  1 g Intravenous Q6H  . citalopram  20 mg Oral Daily  . sodium chloride  3 mL Intravenous Q12H  . traZODone  150 mg Oral QHS   Continuous Infusions:  acetaminophen, ibuprofen, ondansetron (ZOFRAN) IV    PHYSICAL EXAM Filed Vitals:   02/08/15 2000 02/08/15 2100 02/09/15 0157 02/09/15 0200  BP: 129/52 108/40 142/62   Pulse:   59   Temp:   97.5 F (36.4 C)   TempSrc:   Oral   Resp: 16 17 20    Height:    5\' 4"  (1.626 m)  Weight:    144 lb 6.4 oz (65.5 kg)  SpO2:        Well developed and nourished in no acute distress HENT normal Neck supple with JVP-flat Clear Regular rate and rhythm, no murmurs or gallops Abd-soft with active BS No Clubbing cyanosis edema Skin-warm and dry A & Oriented  Grossly normal sensory and motor function Pocket without hematoma, swelling or tenderness   TELEMETRY: Reviewed telemetry pt in  NSR    Intake/Output Summary (Last 24 hours) at 02/09/15 0755 Last data filed at 02/09/15 0300  Gross per 24 hour  Intake    455 ml  Output    200 ml  Net    255 ml    LABS: Basic Metabolic Panel: No results for input(s): NA, K, CL, CO2, GLUCOSE, BUN, CREATININE, CALCIUM, MG, PHOS in the last 168 hours. Cardiac Enzymes: No results for input(s): CKTOTAL, CKMB, CKMBINDEX, TROPONINI in the last 72 hours. CBC: No results for input(s): WBC, NEUTROABS, HGB, HCT, MCV, PLT in the last 168 hours. PROTIME: No results for input(s): LABPROT, INR in the last 72 hours. Liver Function Tests: No results for input(s): AST, ALT, ALKPHOS, BILITOT, PROT, ALBUMIN in the last 72 hours. No results for input(s): LIPASE, AMYLASE in the last  72 hours. BNP: BNP (last 3 results) No results for input(s): BNP in the last 8760 hours.  ProBNP (last 3 results) No results for input(s): PROBNP in the last 8760 hours.  D-Dimer: No results for input(s): DDIMER in the last 72 hours. Hemoglobin A1C: No results for input(s): HGBA1C in the last 72 hours. Fasting Lipid Panel: No results for input(s): CHOL, HDL, LDLCALC, TRIG, CHOLHDL, LDLDIRECT in the last 72 hours. Thyroid Function Tests: No results for input(s): TSH, T4TOTAL, T3FREE, THYROIDAB in the last 72 hours.  Invalid input(s): FREET3 Anemia Panel: No results for input(s): VITAMINB12, FOLATE, FERRITIN, TIBC, IRON, RETICCTPCT in the last 72 hours.   Device Interrogation normal device function   CXR  Good lead placement no PTX   ASSESSMENT AND PLAN:  Active Problems:   Sinus node dysfunction s/p CLS pacing  Will discharge  Instructions given   Signed, Virl Axe MD  02/09/2015

## 2015-02-09 NOTE — Discharge Summary (Signed)
Physician Discharge Summary       Patient ID: Robin Wolfe MRN: [id number] DOB/AGE: 57/18/1957 57 y.o.  Admit date: 02/08/2015 Discharge date: 02/09/2015 Primary Cardiologist:Dr. Oval Linsey EP: Dr. Caryl Comes   Discharge Diagnoses:  Principal Problem:   Sinus node dysfunction Active Problems:   Symptomatic sinus bradycardia   S/P placement of cardiac pacemaker, 02/08/15, Biotronik, MIR compatibile    Discharged Condition: good  Procedures: 02/08/15  PPM placed by Dr. Caryl Comes with MRI compatible leads and BIOTRONIK MIR COMPATIBILE pulse generator serial number [id number].    Hospital Course: 59 year old female seen for bradycardia with normal HR in the 50s and more recently with HR in the 40s.  She is symptomatic with HR in the 40s with low BP and dizziness.   Cardiac evaluation has included Myoview stress testing. Peak heart rate during stress testing was 137. Following exercise she has significant postevent bradycardia and hypotension. She has a history of shower intolerance, heat intolerance as well as orthostatic intolerance.  Because of the abnormal Myoview response, she underwent catheterization demonstrated normal LV function and nonobstructive coronary disease.  She presented for pacemaker placement for symptomatic bradycardia with hypotension and dizziness. With stress test in the post exercise phase of her treadmill which was associated notably with chronotropic competence, she had evidence of autonomic insufficiency with bradycardia and hypotension. She also has shower intolerance heat intolerance and orthostatic intolerance raising the specter of autonomic insufficiency aggravating and or contributing to her symptomatic sinus bradycardia.  Pt did well with procedure and by the next AM was seen and found stable for discharge by Dr. Caryl Comes.  Her pacemaker was interrogated and found within normal limits.  CXR without pneumothorax, + interstitial changes no focal infiltrate.   She will follow up  with device clinic in 10 days or so and with Dr. Caryl Comes in 3 months.  Consults: None  Significant Diagnostic Studies:  BMP Latest Ref Rng 01/30/2015 01/08/2015 09/12/2008  Glucose 70 - 99 mg/dL 97 106(H) 96  BUN 6 - 23 mg/dL 13 12 12   Creatinine 0.40 - 1.20 mg/dL 0.87 0.86 0.71  Sodium 135 - 145 mEq/L 139 138 139  Potassium 3.5 - 5.1 mEq/L 3.5 4.0 4.1  Chloride 96 - 112 mEq/L 103 104 103  CO2 19 - 32 mEq/L 30 27 33(H)  Calcium 8.4 - 10.5 mg/dL 9.5 9.4 9.3   CBC Latest Ref Rng 01/30/2015 01/08/2015 09/12/2008  WBC 4.0 - 10.5 K/uL 3.3 Repeated and verified X2.(L) 3.3(L) 3.7(L)  Hemoglobin 12.0 - 15.0 g/dL 12.6 13.2 11.8(L)  Hematocrit 36.0 - 46.0 % 37.8 39.4 33.6(L)  Platelets 150.0 - 400.0 K/uL 93.0 Repeated and verified X2.(L) 88(L) 101(L)      Discharge Exam: Blood pressure 142/62, pulse 59, temperature 97.5 F (36.4 C), temperature source Oral, resp. rate 20, height 5\' 4"  (1.626 m), weight 144 lb 6.4 oz (65.5 kg), SpO2 95 %.  Disposition: 01-Home or Self Care     Medication List    STOP taking these medications        ibuprofen 200 MG tablet  Commonly known as:  ADVIL,MOTRIN      TAKE these medications        acetaminophen 325 MG tablet  Commonly known as:  TYLENOL  Take 1-2 tablets (325-650 mg total) by mouth every 4 (four) hours as needed for mild pain.     citalopram 20 MG tablet  Commonly known as:  CELEXA  Take 1 tablet by mouth daily.  psyllium 58.6 % powder  Commonly known as:  METAMUCIL  Take 1 packet by mouth daily.     SYSTANE BALANCE 0.6 % Soln  Generic drug:  Propylene Glycol  Apply 2 drops to eye 3 (three) times daily as needed. For dry eyes     traZODone 150 MG tablet  Commonly known as:  DESYREL  Take 150 mg by mouth at bedtime.       Follow-up Information    Follow up with [facility] On 02/20/2015.   Specialty:  Cardiology   Why:  at 4 pm for site check of pacer.   Contact information:   [address], [place] [phone]      Follow up with Virl Axe, MD On 05/16/2015.   Specialty:  Cardiology   Why:  at 3:15 pm with Dr. Margurite Auerbach information:   [address] [id number] [phone]        Discharge Instructions: Heart Healthy Diet  Pacemaker instructions. May return to work on Monday 02/13/15  Signed: SHFWYO,VZCHY R Nurse Practitioner-Certified [facility]: [facility] 02/09/2015, 9:25 AM  Time spent on discharge : > 30 minutes.

## 2015-02-20 ENCOUNTER — Ambulatory Visit: Payer: 59

## 2015-02-20 ENCOUNTER — Ambulatory Visit (INDEPENDENT_AMBULATORY_CARE_PROVIDER_SITE_OTHER): Payer: 59 | Admitting: *Deleted

## 2015-02-20 ENCOUNTER — Encounter: Payer: Self-pay | Admitting: Internal Medicine

## 2015-02-20 DIAGNOSIS — R001 Bradycardia, unspecified: Secondary | ICD-10-CM | POA: Diagnosis not present

## 2015-02-20 DIAGNOSIS — Z950 Presence of cardiac pacemaker: Secondary | ICD-10-CM

## 2015-02-20 LAB — CUP PACEART INCLINIC DEVICE CHECK
Date Time Interrogation Session: 20160919163129
Lead Channel Pacing Threshold Amplitude: 0.6 V
Lead Channel Pacing Threshold Amplitude: 0.6 V
Lead Channel Pacing Threshold Amplitude: 0.8 V
Lead Channel Pacing Threshold Pulse Width: 0.4 ms
Lead Channel Pacing Threshold Pulse Width: 0.4 ms
Lead Channel Pacing Threshold Pulse Width: 0.4 ms
Lead Channel Sensing Intrinsic Amplitude: 3.6 mV
Lead Channel Setting Pacing Amplitude: 3 V
Lead Channel Setting Pacing Pulse Width: 0.4 ms
MDC IDC MSMT LEADCHNL RA IMPEDANCE VALUE: 409 Ohm
MDC IDC MSMT LEADCHNL RA PACING THRESHOLD AMPLITUDE: 0.8 V
MDC IDC MSMT LEADCHNL RA PACING THRESHOLD AMPLITUDE: 0.8 V
MDC IDC MSMT LEADCHNL RA PACING THRESHOLD PULSEWIDTH: 0.4 ms
MDC IDC MSMT LEADCHNL RA SENSING INTR AMPL: 3.7 mV
MDC IDC MSMT LEADCHNL RV IMPEDANCE VALUE: 448 Ohm
MDC IDC MSMT LEADCHNL RV PACING THRESHOLD AMPLITUDE: 0.6 V
MDC IDC MSMT LEADCHNL RV PACING THRESHOLD PULSEWIDTH: 0.4 ms
MDC IDC MSMT LEADCHNL RV PACING THRESHOLD PULSEWIDTH: 0.4 ms
MDC IDC MSMT LEADCHNL RV SENSING INTR AMPL: 8.5 mV
MDC IDC MSMT LEADCHNL RV SENSING INTR AMPL: 8.7 mV
MDC IDC PG SERIAL: 68620792
MDC IDC SET LEADCHNL RV PACING AMPLITUDE: 3 V
MDC IDC STAT BRADY RA PERCENT PACED: 81 %
MDC IDC STAT BRADY RV PERCENT PACED: 0 %

## 2015-02-20 NOTE — Progress Notes (Signed)
Wound check appointment. No steri-strips, dermabond used. Wound without redness or edema. Incision edges approximated, wound well healed. Normal device function. Thresholds, sensing, and impedances consistent with implant measurements. Device programmed at 3.0V/auto capture programmed on for extra safety margin until 3 month visit. Histogram distribution appropriate for patient and level of activity. No mode switches or high ventricular rates noted. Patient educated about wound care, arm mobility, lifting restrictions. ROV w/ SK 05/16/15.

## 2015-03-01 ENCOUNTER — Telehealth: Payer: Self-pay | Admitting: *Deleted

## 2015-03-01 DIAGNOSIS — R531 Weakness: Secondary | ICD-10-CM

## 2015-03-01 NOTE — Telephone Encounter (Signed)
Had been leaving messages for patient since 9/2.  Finally able to reach her today. Instructed her Dr. Caryl Comes would like her to have a cortisol level drawn and explained reasoning. Patient would like to have done in [place].  Instructed her to go by our [place] office in next week/two and pick up requisition to take to lab. Patient verbalized understanding and agreeable to plan.

## 2015-03-07 ENCOUNTER — Telehealth: Payer: Self-pay | Admitting: Internal Medicine

## 2015-03-07 NOTE — Telephone Encounter (Signed)
I called the [place] office to confirm where labs are done. [facility]- 2nd floor of the [employer] building (brick building) directly across from [employer]. Per Varney Biles in the [place] office, [address] should be able to see the order in EPIC, but if not, the patient could come across the street to their office and they could print the order for her. The patient verbalizes understanding.

## 2015-03-07 NOTE — Telephone Encounter (Signed)
New message      Pt is calling to see if we sent an order for lab to [facility] in [place]?  She is wanting to have her lab work but do not know where the order was sent.  Please call

## 2015-03-08 LAB — PROTIME-INR
INR: 1.05 (ref <1.50)
Prothrombin Time: 13.8 seconds (ref 11.6–15.2)

## 2015-03-08 LAB — APTT: aPTT: 39 seconds — ABNORMAL HIGH (ref 24–37)

## 2015-03-09 LAB — BASIC METABOLIC PANEL
BUN: 11 mg/dL (ref 7–25)
CHLORIDE: 103 mmol/L (ref 98–110)
CO2: 30 mmol/L (ref 20–31)
Calcium: 9.6 mg/dL (ref 8.6–10.4)
Creat: 0.82 mg/dL (ref 0.50–1.05)
GLUCOSE: 93 mg/dL (ref 65–99)
POTASSIUM: 4.1 mmol/L (ref 3.5–5.3)
Sodium: 140 mmol/L (ref 135–146)

## 2015-03-09 LAB — LIPID PANEL
CHOLESTEROL: 269 mg/dL — AB (ref 125–200)
HDL: 39 mg/dL — ABNORMAL LOW (ref >=46)
LDL Cholesterol: 199 mg/dL — ABNORMAL HIGH (ref <130)
TRIGLYCERIDES: 157 mg/dL — AB (ref <150)
Total CHOL/HDL Ratio: 6.9 Ratio — ABNORMAL HIGH (ref <=5.0)
VLDL: 31 mg/dL — AB (ref <30)

## 2015-03-09 LAB — TSH: TSH: 1.398 u[IU]/mL (ref 0.350–4.500)

## 2015-03-09 LAB — CBC
HEMATOCRIT: 39.7 % (ref 36.0–46.0)
Hemoglobin: 13.8 g/dL (ref 12.0–15.0)
MCH: 30.7 pg (ref 26.0–34.0)
MCHC: 34.8 g/dL (ref 30.0–36.0)
MCV: 88.2 fL (ref 78.0–100.0)
MPV: 10.9 fL (ref 8.6–12.4)
PLATELETS: 109 10*3/uL — AB (ref 150–400)
RBC: 4.5 MIL/uL (ref 3.87–5.11)
RDW: 15.2 % (ref 11.5–15.5)
WBC: 3.5 10*3/uL — AB (ref 4.0–10.5)

## 2015-03-09 LAB — MAGNESIUM: Magnesium: 1.9 mg/dL (ref 1.5–2.5)

## 2015-03-13 ENCOUNTER — Telehealth: Payer: Self-pay | Admitting: *Deleted

## 2015-03-13 DIAGNOSIS — E785 Hyperlipidemia, unspecified: Secondary | ICD-10-CM

## 2015-03-13 NOTE — Telephone Encounter (Signed)
Unable to leave message ,will call again

## 2015-03-13 NOTE — Telephone Encounter (Signed)
-----   Message from Skeet Latch, MD sent at 03/12/2015 10:40 AM EDT ----- Cholesterol levels are elevated.  Recommend increasing exercise to 30-40 minutes most days of the week and dietary changes (reduce red meat, avoid fatty foods and oils that are solid at room temperature, increase fruits and vegetables).  Repeat lipids in 3 months.  If still elevated, will need to start a medication.

## 2015-03-14 NOTE — Telephone Encounter (Signed)
Spoke to patient. Result given . Verbalized understanding  will Mail labslip for 3 months

## 2015-05-16 ENCOUNTER — Ambulatory Visit (INDEPENDENT_AMBULATORY_CARE_PROVIDER_SITE_OTHER): Payer: 59 | Admitting: Internal Medicine

## 2015-05-16 ENCOUNTER — Encounter: Payer: Self-pay | Admitting: Internal Medicine

## 2015-05-16 VITALS — BP 110/70 | HR 56 | Ht 64.0 in | Wt 149.0 lb

## 2015-05-16 DIAGNOSIS — I495 Sick sinus syndrome: Secondary | ICD-10-CM

## 2015-05-16 DIAGNOSIS — R001 Bradycardia, unspecified: Secondary | ICD-10-CM

## 2015-05-16 NOTE — Patient Instructions (Signed)
Medication Instructions: - no changes  Labwork: - none  Procedures/Testing: - none  Follow-Up: - Remote monitoring is used to monitor your Pacemaker of ICD from home. This monitoring reduces the number of office visits required to check your device to one time per year. It allows [place] to keep an eye on the functioning of your device to ensure it is working properly. You are scheduled for a device check from home on 08/15/15. You may send your transmission at any time that day. If you have a wireless device, the transmission will be sent automatically. After your physician reviews your transmission, you will receive a postcard with your next transmission date.  - Your physician wants you to follow-up in: 9 months with Dr. Caryl Comes. You will receive a reminder letter in the mail two months in advance. If you don't receive a letter, please call our office to schedule the follow-up appointment.  Any Additional Special Instructions Will Be Listed Below (If Applicable). - none

## 2015-05-16 NOTE — Progress Notes (Signed)
Patient Care Team: Celene Squibb, MD as PCP - General (Internal Medicine) Danie Binder, MD (Gastroenterology)   HPI  Robin Wolfe is a 59 y.o. female Seen in follow-up for symptomatic bradycardia  perhaps with a component of autonomic insufficiency. She underwent pacing with a Biotronik device 9/16.  Overall energy is much improved. She does note however on occasion that her heart is beating very rapidly.  Records and Results Reviewed NONE  Past Medical History  Diagnosis Date  . Hypercholesteremia   . 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 Insomnia   . Thrombocytopenia ([place])   . Ventral hernia   . Symptomatic sinus bradycardia 02/09/2015  . S/P placement of cardiac pacemaker, 02/08/15, Biotronik, MIR compatibile  02/09/2015    Past Surgical History  Procedure Laterality Date  . Cerebral aneurysm repair    . Colon surgery    . Cholecystectomy      ileostomyw/ takedown 8/09 Dr Crisoforo Oxford  . Abdominal hysterectomy      endometriosis  . Sigmoidoscopy  12/24/2006    rectal adenocarcinoma/Normal retroflexed view of the rectum  . 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 Colonoscopy  09/12/2008     Small rectal pouch, which made retroflexion challenging/ no polyps detected in the rectum/ No diverticula  . Ventral hernia repair    . Colonoscopy  01/21/2012    Procedure: COLONOSCOPY;  Surgeon: Danie Binder, MD;  [facility];  Service: Endoscopy;  Laterality: N/A;  . Cardiac catheterization N/A 01/16/2015    Procedure: Left Heart Cath and Coronary Angiography;  Surgeon: Burnell Blanks, MD;  Location: [facility];  Service: Cardiovascular;  Laterality: N/A;  . Ep implantable device N/A 02/08/2015    Procedure:  Pacemaker Implant;  Surgeon: Deboraha Sprang, MD;  Location: [facility];  Service: Cardiovascular;  Laterality: N/A;    Current Outpatient Prescriptions  Medication Sig Dispense Refill  . acetaminophen (TYLENOL) 325 MG tablet Take 1-2 tablets (325-650 mg total) by mouth every 4 (four) hours as needed for mild pain.    . citalopram (CELEXA) 20 MG tablet Take 1 tablet by mouth daily.    Marland Kitchen Propylene Glycol (SYSTANE BALANCE) 0.6 % SOLN Apply 2 drops to eye 3 (three) times daily as needed. For dry eyes    . psyllium (METAMUCIL) 58.6 % powder Take 1 packet by mouth daily.    . traZODone (DESYREL) 150 MG tablet Take 150 mg by mouth at bedtime.     No current facility-administered medications for this visit.    Allergies  Allergen Reactions  . Compazine Other (See Comments)    Reaction: seizures  . Hydromorphone Hcl Other (See Comments)    REACTION: hallucinations and behavior issues      Review of Systems negative except from HPI and PMH  Physical Exam BP 110/70 mmHg  Pulse 56  Ht 5\' 4"  (1.626 m)  Wt 149 lb (67.586 kg)  BMI 25.56 kg/m2 Well developed and well nourished in no acute distress HENT normal E scleral and icterus clear Neck Supple JVP flat; carotids brisk and full Clear to ausculation Device pocket well healed; without hematoma or erythema.  There is no tethering   Regular rate and rhythm,  no murmurs gallops or rub Soft with active bowel sounds No clubbing cyanosis  Edema Alert and oriented, grossly normal motor and sensory function Skin Warm and Dry  ECG  APacing @56   17 11/45  Assessment and  Plan  Sinus node dysfunction  Pacemaker-Biotronik   The patient is doing much better. We will increase her lower rate limit from 50--55. We will also decrease her CLS rate as her device is pacing not infrequently up to the 150s range.

## 2015-05-17 LAB — CUP PACEART INCLINIC DEVICE CHECK
Brady Statistic RA Percent Paced: 80 %
Brady Statistic RV Percent Paced: 0 %
Date Time Interrogation Session: 20161213211900
Implantable Lead Implant Date: 20160907
Lead Channel Impedance Value: 390 Ohm
Lead Channel Impedance Value: 526 Ohm
Lead Channel Pacing Threshold Amplitude: 0.8 V
Lead Channel Pacing Threshold Amplitude: 1.2 V
Lead Channel Pacing Threshold Amplitude: 1.2 V
Lead Channel Pacing Threshold Amplitude: 1.6 V
Lead Channel Pacing Threshold Pulse Width: 0.4 ms
Lead Channel Pacing Threshold Pulse Width: 0.4 ms
Lead Channel Pacing Threshold Pulse Width: 0.4 ms
Lead Channel Sensing Intrinsic Amplitude: 3 mV
Lead Channel Sensing Intrinsic Amplitude: 3 mV
Lead Channel Sensing Intrinsic Amplitude: 3 mV
Lead Channel Sensing Intrinsic Amplitude: 4.8 mV
Lead Channel Sensing Intrinsic Amplitude: 4.8 mV
Lead Channel Sensing Intrinsic Amplitude: 5 mV
Lead Channel Setting Pacing Amplitude: 2 V
Lead Channel Setting Pacing Pulse Width: 0.4 ms
MDC IDC LEAD IMPLANT DT: 20160907
MDC IDC LEAD LOCATION: 753859
MDC IDC LEAD LOCATION: 753860
MDC IDC MSMT LEADCHNL RA IMPEDANCE VALUE: 507 Ohm
MDC IDC MSMT LEADCHNL RA IMPEDANCE VALUE: 526 Ohm
MDC IDC MSMT LEADCHNL RA PACING THRESHOLD AMPLITUDE: 0.8 V
MDC IDC MSMT LEADCHNL RA PACING THRESHOLD AMPLITUDE: 0.9 V
MDC IDC MSMT LEADCHNL RA PACING THRESHOLD PULSEWIDTH: 0.4 ms
MDC IDC MSMT LEADCHNL RA SENSING INTR AMPL: 3 mV
MDC IDC MSMT LEADCHNL RA SENSING INTR AMPL: 3 mV
MDC IDC MSMT LEADCHNL RA SENSING INTR AMPL: 3.2 mV
MDC IDC MSMT LEADCHNL RV IMPEDANCE VALUE: 390 Ohm
MDC IDC MSMT LEADCHNL RV IMPEDANCE VALUE: 390 Ohm
MDC IDC MSMT LEADCHNL RV PACING THRESHOLD AMPLITUDE: 1.4 V
MDC IDC MSMT LEADCHNL RV PACING THRESHOLD PULSEWIDTH: 0.4 ms
MDC IDC MSMT LEADCHNL RV PACING THRESHOLD PULSEWIDTH: 0.75 ms
MDC IDC MSMT LEADCHNL RV PACING THRESHOLD PULSEWIDTH: 0.75 ms
MDC IDC MSMT LEADCHNL RV SENSING INTR AMPL: 4.8 mV
MDC IDC MSMT LEADCHNL RV SENSING INTR AMPL: 4.8 mV
MDC IDC MSMT LEADCHNL RV SENSING INTR AMPL: 4.8 mV
MDC IDC PG SERIAL: 68620792
MDC IDC SET LEADCHNL RV PACING AMPLITUDE: 2.6 V

## 2015-05-24 ENCOUNTER — Telehealth: Payer: Self-pay | Admitting: *Deleted

## 2015-05-24 DIAGNOSIS — E785 Hyperlipidemia, unspecified: Secondary | ICD-10-CM

## 2015-05-24 NOTE — Telephone Encounter (Signed)
-----   Message from Raiford Simmonds, RN sent at 03/14/2015  2:03 PM EDT ----- Lipid panel jan 2017 Mail in dec 2016

## 2015-05-24 NOTE — Telephone Encounter (Signed)
Letter and mailed labslip  Lipid  Due before 06/14/15

## 2015-06-15 LAB — LIPID PANEL
CHOLESTEROL: 253 mg/dL — AB (ref 125–200)
HDL: 39 mg/dL — ABNORMAL LOW (ref >=46)
LDL Cholesterol: 185 mg/dL — ABNORMAL HIGH (ref <130)
Total CHOL/HDL Ratio: 6.5 Ratio — ABNORMAL HIGH (ref <=5.0)
Triglycerides: 144 mg/dL (ref <150)
VLDL: 29 mg/dL (ref <30)

## 2015-06-26 ENCOUNTER — Telehealth: Payer: Self-pay | Admitting: *Deleted

## 2015-06-26 MED ORDER — EZETIMIBE 10 MG PO TABS: 10.0000 mg | ORAL_TABLET | Freq: Every day | ORAL | Status: DC

## 2015-06-26 NOTE — Telephone Encounter (Signed)
Spoke to patient. Result given . Verbalized understanding Patient states she has tried zetia in the past, but insurance would not covered medication Informed patient will send prescription again to see if prior authorization is needed.

## 2015-06-26 NOTE — Telephone Encounter (Signed)
-----   Message from Skeet Latch, MD sent at 06/26/2015 12:17 AM EST ----- Cholesterol remains elevated.  Since she cannot take statins, try Zetia 10 mg daily.

## 2015-08-15 ENCOUNTER — Ambulatory Visit (INDEPENDENT_AMBULATORY_CARE_PROVIDER_SITE_OTHER): Payer: 59 | Admitting: *Deleted

## 2015-08-15 DIAGNOSIS — I495 Sick sinus syndrome: Secondary | ICD-10-CM | POA: Diagnosis not present

## 2015-08-15 LAB — CUP PACEART REMOTE DEVICE CHECK
Brady Statistic RA Percent Paced: 80 %
Date Time Interrogation Session: 20170418111504
Implantable Lead Location: 753860
Implantable Lead Model: 5076
Lead Channel Impedance Value: 390 Ohm
Lead Channel Impedance Value: 488 Ohm
Lead Channel Pacing Threshold Amplitude: 1.4 V
Lead Channel Sensing Intrinsic Amplitude: 4.1 mV
Lead Channel Setting Pacing Amplitude: 2 V
MDC IDC LEAD IMPLANT DT: 20160907
MDC IDC LEAD IMPLANT DT: 20160907
MDC IDC LEAD LOCATION: 753859
MDC IDC MSMT LEADCHNL RA PACING THRESHOLD AMPLITUDE: 1 V
MDC IDC MSMT LEADCHNL RA PACING THRESHOLD PULSEWIDTH: 0.4 ms
MDC IDC MSMT LEADCHNL RA SENSING INTR AMPL: 3.2 mV
MDC IDC MSMT LEADCHNL RV PACING THRESHOLD PULSEWIDTH: 0.4 ms
MDC IDC SET LEADCHNL RV PACING AMPLITUDE: 2.6 V
MDC IDC SET LEADCHNL RV PACING PULSEWIDTH: 0.4 ms
MDC IDC STAT BRADY RV PERCENT PACED: 0 %
Pulse Gen Serial Number: 68620792

## 2015-08-15 NOTE — Progress Notes (Signed)
Remote pacemaker transmission.   

## 2015-09-20 ENCOUNTER — Encounter: Payer: Self-pay | Admitting: Cardiology

## 2015-11-14 ENCOUNTER — Ambulatory Visit (INDEPENDENT_AMBULATORY_CARE_PROVIDER_SITE_OTHER): Payer: 59 | Admitting: *Deleted

## 2015-11-14 DIAGNOSIS — I495 Sick sinus syndrome: Secondary | ICD-10-CM | POA: Diagnosis not present

## 2015-11-14 NOTE — Progress Notes (Signed)
Remote pacemaker transmission.   

## 2015-11-21 LAB — CUP PACEART REMOTE DEVICE CHECK
Brady Statistic RA Percent Paced: 80 %
Brady Statistic RV Percent Paced: 0 %
Date Time Interrogation Session: 20170620091303
Implantable Lead Implant Date: 20160907
Implantable Lead Implant Date: 20160907
Implantable Lead Location: 753859
Implantable Lead Location: 753860
Implantable Lead Model: 5076
Implantable Lead Model: 5076
Lead Channel Impedance Value: 371 Ohm
Lead Channel Impedance Value: 468 Ohm
Lead Channel Pacing Threshold Amplitude: 0.9 V
Lead Channel Pacing Threshold Amplitude: 1.5 V
Lead Channel Pacing Threshold Pulse Width: 0.4 ms
Lead Channel Pacing Threshold Pulse Width: 0.4 ms
Lead Channel Sensing Intrinsic Amplitude: 1.9 mV
Lead Channel Sensing Intrinsic Amplitude: 3.5 mV
Lead Channel Setting Pacing Amplitude: 2 V
Lead Channel Setting Pacing Amplitude: 2.6 V
Lead Channel Setting Pacing Pulse Width: 0.4 ms
Pulse Gen Model: 394929
Pulse Gen Serial Number: 68620792

## 2015-11-22 ENCOUNTER — Encounter: Payer: Self-pay | Admitting: Cardiology

## 2015-12-07 ENCOUNTER — Encounter: Payer: Self-pay | Admitting: Cardiology

## 2016-01-17 ENCOUNTER — Emergency Department (HOSPITAL_COMMUNITY)
Admission: EM | Admit: 2016-01-17 | Discharge: 2016-01-17 | Disposition: A | Discharge status: Home / Self Care | Payer: 59 | Attending: Emergency Medicine | Admitting: Emergency Medicine

## 2016-01-17 ENCOUNTER — Emergency Department (HOSPITAL_COMMUNITY): Payer: 59

## 2016-01-17 ENCOUNTER — Encounter (HOSPITAL_COMMUNITY): Payer: Self-pay | Admitting: Emergency Medicine

## 2016-01-17 DIAGNOSIS — Z79899 Other long term (current) drug therapy: Secondary | ICD-10-CM | POA: Diagnosis not present

## 2016-01-17 DIAGNOSIS — R079 Chest pain, unspecified: Secondary | ICD-10-CM | POA: Diagnosis not present

## 2016-01-17 DIAGNOSIS — Z87891 Personal history of nicotine dependence: Secondary | ICD-10-CM | POA: Insufficient documentation

## 2016-01-17 LAB — CBC WITH DIFFERENTIAL/PLATELET
BASOS PCT: 0 %
Basophils Absolute: 0 10*3/uL (ref 0.0–0.1)
EOS ABS: 0.1 10*3/uL (ref 0.0–0.7)
EOS PCT: 2 %
HCT: 38.8 % (ref 36.0–46.0)
Hemoglobin: 12.8 g/dL (ref 12.0–15.0)
Lymphocytes Relative: 29 %
Lymphs Abs: 1 10*3/uL (ref 0.7–4.0)
MCH: 30.5 pg (ref 26.0–34.0)
MCHC: 33 g/dL (ref 30.0–36.0)
MCV: 92.4 fL (ref 78.0–100.0)
MONO ABS: 0.3 10*3/uL (ref 0.1–1.0)
MONOS PCT: 9 %
NEUTROS PCT: 59 %
Neutro Abs: 2 10*3/uL (ref 1.7–7.7)
PLATELETS: 100 10*3/uL — AB (ref 150–400)
RBC: 4.2 MIL/uL (ref 3.87–5.11)
RDW: 14.2 % (ref 11.5–15.5)
WBC: 3.4 10*3/uL — ABNORMAL LOW (ref 4.0–10.5)

## 2016-01-17 LAB — BASIC METABOLIC PANEL
Anion gap: 5 (ref 5–15)
BUN: 9 mg/dL (ref 6–20)
CALCIUM: 9.4 mg/dL (ref 8.9–10.3)
CO2: 28 mmol/L (ref 22–32)
CREATININE: 0.89 mg/dL (ref 0.44–1.00)
Chloride: 105 mmol/L (ref 101–111)
GFR calc non Af Amer: >60 mL/min (ref >60)
Glucose, Bld: 94 mg/dL (ref 65–99)
Potassium: 3.9 mmol/L (ref 3.5–5.1)
SODIUM: 138 mmol/L (ref 135–145)

## 2016-01-17 LAB — HEPATIC FUNCTION PANEL
ALT: 16 U/L (ref 14–54)
AST: 20 U/L (ref 15–41)
Albumin: 4 g/dL (ref 3.5–5.0)
Alkaline Phosphatase: 64 U/L (ref 38–126)
BILIRUBIN DIRECT: 0.1 mg/dL (ref 0.1–0.5)
BILIRUBIN INDIRECT: 0.4 mg/dL (ref 0.3–0.9)
TOTAL PROTEIN: 6.6 g/dL (ref 6.5–8.1)
Total Bilirubin: 0.5 mg/dL (ref 0.3–1.2)

## 2016-01-17 LAB — LIPASE, BLOOD: LIPASE: 36 U/L (ref 11–51)

## 2016-01-17 LAB — TROPONIN I

## 2016-01-17 NOTE — ED Provider Notes (Signed)
[facility] DEPT Provider Note   CSN: [id number] Arrival date & time: 01/17/16  1156     History   Chief Complaint Chief Complaint  Patient presents with  . Chest Pain    HPI Robin Wolfe is a 60 y.o. female.  The history is provided by the patient.  Chest Pain   Pertinent negatives include no abdominal pain, no back pain, no headaches, no nausea, no numbness, no shortness of breath, no vomiting and no weakness.  Patient presents with chest pain. It is dull in her mid to left chest. States yesterday and felt as if she had to burp and when she burped would feel better. No fevers or chills. No shortness of breath. States yesterday did feel little worse and she was walking around. She is not sure if she could walk and make it come on but she just walked in the past without incident. No swelling or legs. She has a pacemaker and headache nonobstructive cardiac cath 1 year ago. No recent travel. She does not smoke. States she had difficulty sleeping last night because when she rolled her left side of her more. States that she took her husband's Protonix and muscle relaxer and was able to sleep.  Past Medical History:  Diagnosis Date  . Hypercholesteremia   . Insomnia   . 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 S/P placement of cardiac pacemaker, 02/08/15, Biotronik, MIR compatibile  02/09/2015  . Symptomatic sinus bradycardia 02/09/2015  . Thrombocytopenia ([place])   . Ventral hernia     Patient Active Problem List   Diagnosis Date Noted  . Symptomatic sinus bradycardia 02/09/2015  . S/P placement of cardiac pacemaker, 02/08/15, Biotronik, MIR compatibile  02/09/2015  . Sinus node dysfunction ([place]) 02/08/2015  . Abnormal cardiovascular function   . Insomnia   . Rectal adenocarcinoma ([place])   . Radial head fracture, closed 12/31/2010  . Pain in left elbow 12/31/2010  . ADENOCARCINOMA, RECTOSIGMOID COLON 09/07/2008    Past Surgical History:    Procedure Laterality Date  . ABDOMINAL HYSTERECTOMY     endometriosis  . CARDIAC CATHETERIZATION N/A 01/16/2015   Procedure: Left Heart Cath and Coronary Angiography;  Surgeon: Burnell Blanks, MD;  Location: [facility];  Service: Cardiovascular;  Laterality: N/A;  . CEREBRAL ANEURYSM REPAIR    . CHOLECYSTECTOMY     ileostomyw/ takedown 8/09 Dr Crisoforo Oxford  . COLON SURGERY    . 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 COLONOSCOPY  09/12/2008    Small rectal pouch, which made retroflexion challenging/ no polyps detected in the rectum/ No diverticula  . COLONOSCOPY  01/21/2012   Procedure: COLONOSCOPY;  Surgeon: Danie Binder, MD;  [facility];  Service: Endoscopy;  Laterality: N/A;  . EP IMPLANTABLE DEVICE N/A 02/08/2015   Procedure: Pacemaker Implant;  Surgeon: Deboraha Sprang, MD;  Location: [facility];  Service: Cardiovascular;  Laterality: N/A;  . SIGMOIDOSCOPY  12/24/2006   rectal adenocarcinoma/Normal retroflexed view of the rectum  . VENTRAL HERNIA REPAIR      OB History    No data available       Home Medications    Prior to Admission medications   Medication Sig Start Date End Date Taking? Authorizing Provider  acetaminophen (  TYLENOL) 325 MG tablet Take 1-2 tablets (325-650 mg total) by mouth every 4 (four) hours as needed for mild pain. 02/09/15   Isaiah Serge, NP  citalopram (CELEXA) 20 MG tablet Take 1 tablet by mouth daily. 12/27/14   Historical Provider, MD  ezetimibe (ZETIA) 10 MG tablet Take 1 tablet (10 mg total) by mouth daily. 06/26/15   Skeet Latch, MD  Propylene Glycol (SYSTANE BALANCE) 0.6 % SOLN Apply 2 drops to eye 3 (three) times daily as needed. For dry eyes    Historical Provider, MD  psyllium (METAMUCIL) 58.6 % powder Take  1 packet by mouth daily.    Historical Provider, MD  traZODone (DESYREL) 150 MG tablet Take 150 mg by mouth at bedtime.    Historical Provider, MD    Family History Family History  Problem Relation Age of Onset  . Heart disease    . Lung disease    . Diabetes    . Colon cancer Neg Hx     Social History Social History  Substance Use Topics  . Smoking status: Former Smoker    Packs/day: 1.50    Years: 8.00    Types: Cigarettes    Quit date: 01/06/2007  . Smokeless tobacco: Never Used  . Alcohol use 0.0 oz/week     Comment: rarely     Allergies   Compazine and Hydromorphone hcl   Review of Systems Review of Systems  Constitutional: Negative for activity change and appetite change.  Eyes: Negative for pain.  Respiratory: Negative for chest tightness and shortness of breath.   Cardiovascular: Positive for chest pain. Negative for leg swelling.  Gastrointestinal: Negative for abdominal pain, diarrhea, nausea and vomiting.  Genitourinary: Negative for flank pain.  Musculoskeletal: Negative for back pain and neck stiffness.  Skin: Negative for rash.  Neurological: Negative for weakness, numbness and headaches.  Psychiatric/Behavioral: Negative for behavioral problems.     Physical Exam Updated Vital Signs BP 140/79   Pulse 61   Temp 97.8 F (36.6 C) (Oral)   Resp 16   Ht 5\' 4"  (1.626 m)   Wt 150 lb (68 kg)   SpO2 98%   BMI 25.75 kg/m   Physical Exam  Constitutional: She appears well-developed and well-nourished.  HENT:  Head: Atraumatic.  Eyes: EOM are normal.  Neck: Neck supple.  Cardiovascular: Normal rate.   Pulmonary/Chest: Effort normal.  Abdominal: There is tenderness.  Epigastric tenderness without rebound or guarding.  Musculoskeletal: She exhibits no edema.  Neurological: She is alert.  Skin: Skin is warm.  Psychiatric: She has a normal mood and affect.     ED Treatments / Results  Labs (all labs ordered are listed, but only abnormal  results are displayed) Labs Reviewed  CBC WITH DIFFERENTIAL/PLATELET - Abnormal; Notable for the following:       Result Value   WBC 3.4 (*)    Platelets 100 (*)    All other components within normal limits  BASIC METABOLIC PANEL  TROPONIN I  HEPATIC FUNCTION PANEL  LIPASE, BLOOD    EKG  EKG Interpretation  Date/Time:  Wednesday January 17 2016 12:01:44 EDT Ventricular Rate:  68 PR Interval:    QRS Duration: 124 QT Interval:  496 QTC Calculation: 528 R Axis:   -17 Text Interpretation:  Atrial-paced rhythm Nonspecific intraventricular conduction delay Confirmed by Alvino Chapel  MD, Latesha Chesney [phone]) on 01/17/2016 12:22:05 PM       Radiology Dg Chest 2 View  Result Date: 01/17/2016 CLINICAL DATA:  Chest  pain beginning last night EXAM: CHEST  2 VIEW COMPARISON:  02/09/2015 FINDINGS: Dual-chamber pacer leads from the left are unremarkable. There is no edema, consolidation, effusion, or pneumothorax. Stable biapical pleural thickening. No acute osseous finding. IMPRESSION: Stable.  No evidence of acute disease. Electronically Signed   By: Monte Fantasia M.D.   On: 01/17/2016 12:37    Procedures Procedures (including critical care time)  Medications Ordered in ED Medications - No data to display   Initial Impression / Assessment and Plan / ED Course  I have reviewed the triage vital signs and the nursing notes.  Pertinent labs & imaging results that were available during my care of the patient were reviewed by me and considered in my medical decision making (see chart for details).  Clinical Course    Patient with chest pain. EKG reassuring. Blood work reassuring. X-ray negative for infiltrate or other abnormality. Nonobstructive heart cath 1 year ago. Possible GERD was the cause. Will discharge home to follow-up as needed.  Final Clinical Impressions(s) / ED Diagnoses   Final diagnoses:  Chest pain, unspecified chest pain type    New Prescriptions New Prescriptions   No  medications on file     Davonna Belling, MD 01/17/16 1418

## 2016-01-17 NOTE — ED Triage Notes (Signed)
Cp started last night to mid chest, sharp, worse with movement. Some sob intermittent, none now. Nondiaphoretic. Pain to chest 3.

## 2016-01-26 ENCOUNTER — Ambulatory Visit (INDEPENDENT_AMBULATORY_CARE_PROVIDER_SITE_OTHER): Payer: 59 | Admitting: Cardiovascular Disease

## 2016-01-26 ENCOUNTER — Encounter: Payer: Self-pay | Admitting: Cardiovascular Disease

## 2016-01-26 VITALS — BP 119/68 | HR 75 | Ht 64.0 in | Wt 152.2 lb

## 2016-01-26 DIAGNOSIS — I495 Sick sinus syndrome: Secondary | ICD-10-CM

## 2016-01-26 DIAGNOSIS — E785 Hyperlipidemia, unspecified: Secondary | ICD-10-CM | POA: Diagnosis not present

## 2016-01-26 DIAGNOSIS — K219 Gastro-esophageal reflux disease without esophagitis: Secondary | ICD-10-CM | POA: Diagnosis not present

## 2016-01-26 NOTE — Progress Notes (Signed)
Cardiology Office Note   Date:  01/26/2016   ID:  Robin, Wolfe Oct 03, 1955, MRN [id number]  PCP:  Wende Neighbors, MD  Cardiologist:   Skeet Latch, MD  EP: Caryl Comes  Chief Complaint  Patient presents with  . Follow-up    Pt states no Sx and no concerns. Pt mistake chest pain with gastric reflux.    History of Present Illness: Robin Wolfe is a 60 y.o. female with prior colon cancer s/p ileostomy with take down and chemotherapy and sinus node dysfunction s/p PPM who presents for an evaluation of chest pain.  Robin Wolfe was seen 01/06/15 with a report of bradycardia.  She had a 24-hour Holter that revealed episodes of sinus bradycardia. She was referred to Dr. Caryl Comes and had a Biotronik pacemaker implanted on 02/08/15.  She also reported exertional chest pressure and was referred for nuclear stress testing that revealed normal ejection fraction with ST depression inferolaterally. There was concern that there may be balanced ischemia. She subsequently underwent left heart catheterization on 01/16/15 that revealed 30% proximal LAD and D1 lesions and was otherwise unremarkable. Her cholesterol is elevated so she was started on Zetia January 2017.  She has tolerated this well and her most recent LDL was 128.  She has been unable to tolerate atorvastatin, simvastatin, or rosuvastatin.   Robin Wolfe was seen in the ED earlier this week was chest pain.  The episodes occurred at rest and were alleviated by a PPI.  She decided to start taking Prilosec and her symptoms resolved.   She Hasn't been able to exercise much lately. She has been caring for her husband who has lung cancer.  He has undergone chemotherapy and radiation. He is currently waiting to start immunotherapy.  She has been otherwise well and denies shortness of breath, lower extremity edema, orthopnea or PND. She no longer has dizziness since getting her pacemaker.   Past Medical History:  Diagnosis Date  . Hypercholesteremia   . Insomnia     . 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 S/P placement of cardiac pacemaker, 02/08/15, Biotronik, MIR compatibile  02/09/2015  . Symptomatic sinus bradycardia 02/09/2015  . Thrombocytopenia ([place])   . Ventral hernia     Past Surgical History:  Procedure Laterality Date  . ABDOMINAL HYSTERECTOMY     endometriosis  . CARDIAC CATHETERIZATION N/A 01/16/2015   Procedure: Left Heart Cath and Coronary Angiography;  Surgeon: Burnell Blanks, MD;  Location: [facility];  Service: Cardiovascular;  Laterality: N/A;  . CEREBRAL ANEURYSM REPAIR    . CHOLECYSTECTOMY     ileostomyw/ takedown 8/09 Dr Crisoforo Oxford  . COLON SURGERY    . 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 COLONOSCOPY  09/12/2008    Small rectal pouch, which made retroflexion challenging/ no polyps detected in the rectum/ No diverticula  . COLONOSCOPY  01/21/2012   Procedure: COLONOSCOPY;  Surgeon: Danie Binder, MD;  [facility];  Service: Endoscopy;  Laterality: N/A;  . EP IMPLANTABLE DEVICE N/A 02/08/2015   Procedure: Pacemaker Implant;  Surgeon: Deboraha Sprang, MD;  Location: [facility];  Service: Cardiovascular;  Laterality: N/A;  . SIGMOIDOSCOPY  12/24/2006   rectal adenocarcinoma/Normal retroflexed view of the rectum  .  VENTRAL HERNIA REPAIR       Current Outpatient Prescriptions  Medication Sig Dispense Refill  . acetaminophen (TYLENOL) 325 MG tablet Take 1-2 tablets (325-650 mg total) by mouth every 4 (four) hours as needed for mild pain.    Marland Kitchen. ezetimibe (ZETIA) 10 MG tablet Take 1 tablet (10 mg total) by mouth daily. 30 tablet 6  . Propylene Glycol (SYSTANE BALANCE) 0.6 % SOLN Apply 2 drops to eye 3 (three) times daily as needed. For dry eyes    . psyllium  (METAMUCIL) 58.6 % powder Take 1 packet by mouth daily.    . traZODone (DESYREL) 150 MG tablet Take 150 mg by mouth at bedtime.     No current facility-administered medications for this visit.     Allergies:   Compazine and Hydromorphone hcl    Social History:  The patient  reports that she quit smoking about 9 years ago. Her smoking use included Cigarettes. She has a 12.00 pack-year smoking history. She has never used smokeless tobacco. She reports that she drinks alcohol. She reports that she does not use drugs.   Family History:  The patient's family history is not on file.    ROS:  Please see the history of present illness.  Otherwise, review of systems are positive for none.   All other systems are reviewed and negative.    PHYSICAL EXAM: VS:  BP 119/68   Pulse 75   Ht 5\' 4"  (1.626 m)   Wt 152 lb 3.2 oz (69 kg)   BMI 26.13 kg/m  , BMI Body mass index is 26.13 kg/m. GENERAL:  Well appearing HEENT:  Pupils equal round and reactive, fundi not visualized, oral mucosa unremarkable NECK:  No jugular venous distention, waveform within normal limits, carotid upstroke brisk and symmetric, no bruits, no thyromegaly LYMPHATICS:  No cervical adenopathy LUNGS:  Clear to auscultation bilaterally HEART:  RRR.  PMI not displaced or sustained, nl S1, prominent S2, no S3, no S4, no clicks, no rubs, II/VI systolic murmur at LUSB with radiation to bilateral carotids (longstanding per patient).  ABD:  Flat, positive bowel sounds normal in frequency in pitch, no bruits, no rebound, no guarding, no midline pulsatile mass, no hepatomegaly, no splenomegaly EXT:  2 plus pulses throughout, no edema, no cyanosis no clubbing SKIN:  No rashes no nodules NEURO:  Cranial nerves II through XII grossly intact, motor grossly intact throughout PSYCH:  Cognitively intact, oriented to person place and time   EKG:  EKG is ordered today. The ekg ordered today demonstrates sinus bradycardia at 51 bpm.  LAFB.   Prior anteroseptal infarct.  LHC 01/16/15:  Prox LAD lesion, 30% stenosed.  1st Diag lesion, 30% stenosed.  The left ventricular systolic function is normal.   1. Mild non-obstructive CAD 2. Normal LV systolic function   Lexiscan Myoview 01/12/15:  The left ventricular ejection fraction is mildly decreased (45-54%).  Nuclear stress EF: 52%.  Horizontal ST segment depression ST segment depression of 4 mm was noted during stress in the II, III, aVF, V6, V5 and V4 leads, beginning at 6 minutes of stress.  This is a low risk study.  Recent Labs: 03/08/2015: Magnesium 1.9; TSH 1.398 01/17/2016: ALT 16; BUN 9; Creatinine, Ser 0.89; Hemoglobin 12.8; Platelets 100; Potassium 3.9; Sodium 138    Lipid Panel    Component Value Date/Time   CHOL 253 (H) 06/14/2015 0811   TRIG 144 06/14/2015 0811   HDL 39 (L) 06/14/2015 0811   CHOLHDL 6.5 (H)  06/14/2015 0811   VLDL 29 06/14/2015 0811   LDLCALC 185 (H) 06/14/2015 0811   12/25/15: Sodium 139, potassium 4.1, BUN 8, creatinine 0.84 AST 14, ALT 11 WBC 4.23, hemoglobin 12.6, hematocrit 39, platelets 107 Total cholesterol 196, triglycerides 119, HDL 44, LDL 128 TSH 1.83 Hemoglobin A1c 5.6%  Wt Readings from Last 3 Encounters:  01/26/16 152 lb 3.2 oz (69 kg)  01/17/16 150 lb (68 kg)  05/16/15 149 lb (67.6 kg)      Other studies Reviewed: Additional studies/ records that were reviewed today include: medical record . Review of the above records demonstrates:  Please see elsewhere in the note.     ASSESSMENT AND PLAN:  # Sinus node dysfunction:  Symptoms have resolved after implantation of permanent pacemaker.  This is managed by Dr. Caryl Comes.  # Hyperlipidemia: Much better-controlled on Zetia.  We will not make any changes at this time. She has been intolerant of statins.  # Chest pain: Attributable to GERD and resolved on PPI.   Current medicines are reviewed at length with the patient today.  The patient does not have concerns  regarding medicines.  The following changes have been made:  no change  Labs/ tests ordered today include: No orders of the defined types were placed in this encounter.    Disposition:   FU with Clela Hagadorn C. Oval Linsey, MD, [facility] as needed.    Signed, Skeet Latch, MD  01/26/2016 4:21 PM    [facility]

## 2016-02-13 ENCOUNTER — Encounter: Payer: Self-pay | Admitting: Internal Medicine

## 2016-03-04 ENCOUNTER — Encounter: Payer: 59 | Admitting: Internal Medicine

## 2016-05-15 ENCOUNTER — Encounter: Payer: Self-pay | Admitting: Internal Medicine

## 2016-05-22 ENCOUNTER — Encounter: Payer: Self-pay | Admitting: Internal Medicine

## 2016-05-22 ENCOUNTER — Ambulatory Visit (INDEPENDENT_AMBULATORY_CARE_PROVIDER_SITE_OTHER): Payer: 59 | Admitting: Internal Medicine

## 2016-05-22 VITALS — BP 122/84 | HR 91 | Ht 64.0 in | Wt 153.0 lb

## 2016-05-22 DIAGNOSIS — R001 Bradycardia, unspecified: Secondary | ICD-10-CM

## 2016-05-22 DIAGNOSIS — I495 Sick sinus syndrome: Secondary | ICD-10-CM

## 2016-05-22 DIAGNOSIS — Z950 Presence of cardiac pacemaker: Secondary | ICD-10-CM

## 2016-05-22 LAB — CUP PACEART INCLINIC DEVICE CHECK
Battery Remaining Longevity: 98 mo
Brady Statistic RA Percent Paced: 83 %
Date Time Interrogation Session: 20171220165520
Implantable Lead Location: 753859
Implantable Lead Model: 5076
Implantable Pulse Generator Implant Date: 20160907
Lead Channel Pacing Threshold Pulse Width: 0.4 ms
Lead Channel Sensing Intrinsic Amplitude: 3.3 mV
Lead Channel Sensing Intrinsic Amplitude: 5.5 mV
Lead Channel Setting Pacing Amplitude: 2 V
Lead Channel Setting Pacing Amplitude: 2.6 V
Lead Channel Setting Pacing Pulse Width: 0.4 ms
MDC IDC LEAD IMPLANT DT: 20160907
MDC IDC LEAD IMPLANT DT: 20160907
MDC IDC LEAD LOCATION: 753860
MDC IDC MSMT LEADCHNL RA IMPEDANCE VALUE: 448 Ohm
MDC IDC MSMT LEADCHNL RA PACING THRESHOLD AMPLITUDE: 0.6 V
MDC IDC MSMT LEADCHNL RA PACING THRESHOLD PULSEWIDTH: 0.4 ms
MDC IDC MSMT LEADCHNL RV IMPEDANCE VALUE: 409 Ohm
MDC IDC MSMT LEADCHNL RV PACING THRESHOLD AMPLITUDE: 1.1 V
MDC IDC PG SERIAL: 68620792
MDC IDC STAT BRADY RV PERCENT PACED: 0 %
Pulse Gen Model: 394929

## 2016-05-22 NOTE — Patient Instructions (Addendum)
Medication Instructions:  Your physician recommends that you continue on your current medications as directed. Please refer to the Current Medication list given to you today.   Labwork: None Ordered   Testing/Procedures: None Ordered  Follow-Up: Your physician wants you to follow-up in: 1 year with Dr. Caryl Comes. You will receive a reminder letter in the mail two months in advance. If you don't receive a letter, please call our office to schedule the follow-up appointment.  Remote monitoring is used to monitor your Pacemaker from home. This monitoring reduces the number of office visits required to check your device to one time per year. It allows [place] to keep an eye on the functioning of your device to ensure it is working properly. You are scheduled for a device check from home on 08/21/16. You may send your transmission at any time that day. If you have a wireless device, the transmission will be sent automatically. After your physician reviews your transmission, you will receive a postcard with your next transmission date.    Any Other Special Instructions Will Be Listed Below (If Applicable).     If you need a refill on your cardiac medications before your next appointment, please call your pharmacy.

## 2016-05-22 NOTE — Progress Notes (Signed)
Patient Care Team: Celene Squibb, MD as PCP - General (Internal Medicine) Danie Binder, MD (Gastroenterology)   HPI  Robin Wolfe is a 60 y.o. female Seen in follow-up for symptomatic bradycardia  perhaps with a component of autonomic insufficiency. She underwent pacing with a Biotronik device 9/16.  Overall energy was much improved. \  However, now she is exhausted,  Her husband was diagnosed with small cell cancer with mets and she continues to work full time and care for him  She says his mind is gone It is very stressful She is not sleeping well  She denies daytime somnolence She is no longer taking her antidepressants    Records and Results Reviewed NONE  Past Medical History:  Diagnosis Date  . Hypercholesteremia   . Insomnia   . 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 S/P placement of cardiac pacemaker, 02/08/15, Biotronik, MIR compatibile  02/09/2015  . Symptomatic sinus bradycardia 02/09/2015  . Thrombocytopenia ([place])   . Ventral hernia     Past Surgical History:  Procedure Laterality Date  . ABDOMINAL HYSTERECTOMY     endometriosis  . CARDIAC CATHETERIZATION N/A 01/16/2015   Procedure: Left Heart Cath and Coronary Angiography;  Surgeon: Burnell Blanks, MD;  Location: [facility];  Service: Cardiovascular;  Laterality: N/A;  . CEREBRAL ANEURYSM REPAIR    . CHOLECYSTECTOMY     ileostomyw/ takedown 8/09 Dr Crisoforo Oxford  . COLON SURGERY    . 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 COLONOSCOPY  09/12/2008    Small rectal pouch, which made retroflexion challenging/ no polyps detected in the rectum/ No diverticula  . COLONOSCOPY  01/21/2012   Procedure: COLONOSCOPY;  Surgeon: Danie Binder, MD;  [facility];  Service: Endoscopy;  Laterality: N/A;  . EP IMPLANTABLE DEVICE N/A 02/08/2015   Procedure: Pacemaker Implant;  Surgeon: Deboraha Sprang, MD;  Location: [facility];  Service: Cardiovascular;  Laterality: N/A;  . SIGMOIDOSCOPY  12/24/2006   rectal adenocarcinoma/Normal retroflexed view of the rectum  . VENTRAL HERNIA REPAIR      Current Outpatient Prescriptions  Medication Sig Dispense Refill  . acetaminophen (TYLENOL) 325 MG tablet Take 1-2 tablets (325-650 mg total) by mouth every 4 (four) hours as needed for mild pain.    Marland Kitchen ezetimibe (ZETIA) 10 MG tablet Take 1 tablet (10 mg total) by mouth daily. 30 tablet 6  . Propylene Glycol (SYSTANE BALANCE) 0.6 % SOLN Apply 2 drops to eye 3 (three) times daily as needed. For dry eyes    . psyllium (METAMUCIL) 58.6 % powder Take 1 packet by mouth daily.    . traZODone (DESYREL) 150 MG tablet Take 150 mg by mouth at bedtime.     No current facility-administered medications for this visit.     Allergies  Allergen Reactions  . Compazine Other (See Comments)    Reaction: seizures  . Hydromorphone Hcl Other (See Comments)    REACTION: hallucinations and behavior issues      Review of Systems negative except from HPI and PMH  Physical Exam BP 122/84   Pulse 91   Ht 5\' 4"  (1.626 m)   Wt 153 lb (69.4 kg)   SpO2 98%  BMI 26.26 kg/m  Well developed and well nourished in no acute distress HENT normal E scleral and icterus clear Neck Supple JVP flat; carotids brisk and full Clear to ausculation Device pocket well healed; without hematoma or erythema.  There is no tethering   Regular rate and rhythm, no murmurs gallops or rub Soft with active bowel sounds No clubbing cyanosis  Edema Alert and oriented, grossly normal motor and sensory function Skin Warm and Dry  ECG  APacing @72   17 11/45  Assessment and  Plan  Sinus node dysfunction  Pacemaker-Biotronik  Stress/ depression    We reprogrammed the device to help    But mostly discussed self care given the incredible stress underwhich she finds herself  Have suggested she discuss with her PCP about resumng her antidepressant   More than 50% of 45 min was spent in counseling related to the above

## 2016-07-04 DIAGNOSIS — N3281 Overactive bladder: Secondary | ICD-10-CM | POA: Diagnosis not present

## 2016-07-04 DIAGNOSIS — J209 Acute bronchitis, unspecified: Secondary | ICD-10-CM | POA: Diagnosis not present

## 2016-07-04 DIAGNOSIS — E785 Hyperlipidemia, unspecified: Secondary | ICD-10-CM | POA: Diagnosis not present

## 2016-08-21 ENCOUNTER — Ambulatory Visit (INDEPENDENT_AMBULATORY_CARE_PROVIDER_SITE_OTHER): Payer: 59 | Admitting: *Deleted

## 2016-08-21 ENCOUNTER — Telehealth: Payer: Self-pay | Admitting: Cardiology

## 2016-08-21 DIAGNOSIS — I495 Sick sinus syndrome: Secondary | ICD-10-CM

## 2016-08-21 NOTE — Telephone Encounter (Signed)
Spoke with pt and reminded pt of remote transmission that is due today. Pt verbalized understanding.   

## 2016-08-22 ENCOUNTER — Encounter: Payer: Self-pay | Admitting: Cardiology

## 2016-08-22 NOTE — Progress Notes (Signed)
Remote pacemaker transmission.   

## 2016-08-26 LAB — CUP PACEART REMOTE DEVICE CHECK
Brady Statistic RA Percent Paced: 90 %
Implantable Lead Implant Date: 20160907
Implantable Lead Location: 753859
Implantable Lead Model: 5076
Implantable Pulse Generator Implant Date: 20160907
Lead Channel Impedance Value: 371 Ohm
Lead Channel Impedance Value: 449 Ohm
Lead Channel Pacing Threshold Pulse Width: 0.4 ms
Lead Channel Pacing Threshold Pulse Width: 0.4 ms
Lead Channel Sensing Intrinsic Amplitude: 4.6 mV
MDC IDC LEAD IMPLANT DT: 20160907
MDC IDC LEAD LOCATION: 753860
MDC IDC MSMT LEADCHNL RA PACING THRESHOLD AMPLITUDE: 0.7 V
MDC IDC MSMT LEADCHNL RA SENSING INTR AMPL: 3.7 mV
MDC IDC MSMT LEADCHNL RV PACING THRESHOLD AMPLITUDE: 1.4 V
MDC IDC SESS DTM: 20180326091719
MDC IDC STAT BRADY RV PERCENT PACED: 0 %
Pulse Gen Serial Number: 68620792

## 2016-09-05 ENCOUNTER — Encounter: Payer: Self-pay | Admitting: Cardiology

## 2016-09-11 ENCOUNTER — Ambulatory Visit (INDEPENDENT_AMBULATORY_CARE_PROVIDER_SITE_OTHER): Payer: Self-pay | Admitting: Orthopaedic Surgery

## 2016-10-01 ENCOUNTER — Other Ambulatory Visit (HOSPITAL_COMMUNITY): Payer: Self-pay | Admitting: Internal Medicine

## 2016-10-01 DIAGNOSIS — Z1231 Encounter for screening mammogram for malignant neoplasm of breast: Secondary | ICD-10-CM

## 2016-10-14 ENCOUNTER — Ambulatory Visit (HOSPITAL_COMMUNITY)
Admission: RE | Admit: 2016-10-14 | Discharge: 2016-10-14 | Disposition: A | Discharge status: Home / Self Care | Payer: 59 | Source: Ambulatory Visit | Attending: Internal Medicine | Admitting: Internal Medicine

## 2016-10-14 DIAGNOSIS — R928 Other abnormal and inconclusive findings on diagnostic imaging of breast: Secondary | ICD-10-CM | POA: Diagnosis not present

## 2016-10-14 DIAGNOSIS — Z1231 Encounter for screening mammogram for malignant neoplasm of breast: Secondary | ICD-10-CM | POA: Insufficient documentation

## 2016-10-17 ENCOUNTER — Other Ambulatory Visit (HOSPITAL_COMMUNITY): Payer: Self-pay | Admitting: Internal Medicine

## 2016-10-17 DIAGNOSIS — R928 Other abnormal and inconclusive findings on diagnostic imaging of breast: Secondary | ICD-10-CM

## 2016-10-22 ENCOUNTER — Ambulatory Visit (HOSPITAL_COMMUNITY)
Admission: RE | Admit: 2016-10-22 | Discharge: 2016-10-22 | Disposition: A | Discharge status: Home / Self Care | Payer: 59 | Source: Ambulatory Visit | Attending: Internal Medicine | Admitting: Internal Medicine

## 2016-10-22 DIAGNOSIS — R928 Other abnormal and inconclusive findings on diagnostic imaging of breast: Secondary | ICD-10-CM | POA: Insufficient documentation

## 2016-10-22 DIAGNOSIS — R922 Inconclusive mammogram: Secondary | ICD-10-CM | POA: Diagnosis not present

## 2016-10-22 DIAGNOSIS — N6489 Other specified disorders of breast: Secondary | ICD-10-CM | POA: Diagnosis not present

## 2016-10-23 ENCOUNTER — Other Ambulatory Visit (HOSPITAL_COMMUNITY): Payer: Self-pay | Admitting: Internal Medicine

## 2016-10-23 DIAGNOSIS — N6310 Unspecified lump in the right breast, unspecified quadrant: Secondary | ICD-10-CM

## 2016-11-05 ENCOUNTER — Other Ambulatory Visit (HOSPITAL_COMMUNITY): Payer: Self-pay | Admitting: Internal Medicine

## 2016-11-05 ENCOUNTER — Ambulatory Visit (HOSPITAL_COMMUNITY)
Admission: RE | Admit: 2016-11-05 | Discharge: 2016-11-05 | Disposition: A | Discharge status: Home / Self Care | Payer: 59 | Source: Ambulatory Visit | Attending: Internal Medicine | Admitting: Internal Medicine

## 2016-11-05 DIAGNOSIS — N6310 Unspecified lump in the right breast, unspecified quadrant: Secondary | ICD-10-CM | POA: Diagnosis present

## 2016-11-05 DIAGNOSIS — N6311 Unspecified lump in the right breast, upper outer quadrant: Secondary | ICD-10-CM | POA: Diagnosis not present

## 2016-11-05 DIAGNOSIS — N6011 Diffuse cystic mastopathy of right breast: Secondary | ICD-10-CM | POA: Diagnosis not present

## 2016-11-05 MED ORDER — LIDOCAINE HCL (PF) 1 % IJ SOLN
INTRAMUSCULAR | Status: AC
  Administered 2016-11-05: 5 mL
  Filled 2016-11-05: qty 5

## 2016-11-05 MED ORDER — LIDOCAINE-EPINEPHRINE (PF) 1 %-1:200000 IJ SOLN
INTRAMUSCULAR | Status: AC
  Administered 2016-11-05: 5 mL
  Filled 2016-11-05: qty 30

## 2016-11-21 ENCOUNTER — Ambulatory Visit (INDEPENDENT_AMBULATORY_CARE_PROVIDER_SITE_OTHER): Payer: 59 | Admitting: *Deleted

## 2016-11-21 DIAGNOSIS — I495 Sick sinus syndrome: Secondary | ICD-10-CM

## 2016-11-21 NOTE — Progress Notes (Signed)
Remote pacemaker transmission.   

## 2016-11-22 ENCOUNTER — Encounter: Payer: Self-pay | Admitting: Cardiology

## 2016-11-26 ENCOUNTER — Encounter: Payer: Self-pay | Admitting: Gastroenterology

## 2016-12-04 LAB — CUP PACEART REMOTE DEVICE CHECK
Brady Statistic AP VP Percent: 0 %
Brady Statistic AP VS Percent: 97 %
Brady Statistic AS VP Percent: 0 %
Brady Statistic AS VS Percent: 3 %
Implantable Lead Implant Date: 20160907
Implantable Lead Implant Date: 20160907
Implantable Lead Location: 753860
Implantable Lead Model: 5076
Lead Channel Impedance Value: 375 Ohm
Lead Channel Impedance Value: 421 Ohm
Lead Channel Pacing Threshold Amplitude: 0.7 V
Lead Channel Pacing Threshold Pulse Width: 0.4 ms
Lead Channel Pacing Threshold Pulse Width: 0.4 ms
Lead Channel Setting Pacing Amplitude: 2 V
MDC IDC LEAD LOCATION: 753859
MDC IDC MSMT LEADCHNL RV PACING THRESHOLD AMPLITUDE: 1.3 V
MDC IDC PG IMPLANT DT: 20160907
MDC IDC SESS DTM: 20180704063110
MDC IDC SET LEADCHNL RV PACING AMPLITUDE: 2.6 V
MDC IDC SET LEADCHNL RV PACING PULSEWIDTH: 0.4 ms
MDC IDC STAT BRADY RA PERCENT PACED: 97 %
MDC IDC STAT BRADY RV PERCENT PACED: 0 %
Pulse Gen Serial Number: 68620792

## 2016-12-06 ENCOUNTER — Encounter: Payer: Self-pay | Admitting: Cardiology

## 2017-02-17 DIAGNOSIS — S81852A Open bite, left lower leg, initial encounter: Secondary | ICD-10-CM | POA: Diagnosis not present

## 2017-02-20 ENCOUNTER — Encounter (HOSPITAL_COMMUNITY): Payer: Self-pay | Admitting: *Deleted

## 2017-02-20 ENCOUNTER — Observation Stay (HOSPITAL_COMMUNITY)
Admission: EM | Admit: 2017-02-20 | Discharge: 2017-02-21 | Disposition: A | Discharge status: Home / Self Care | Payer: 59 | Attending: Internal Medicine | Admitting: Internal Medicine

## 2017-02-20 ENCOUNTER — Ambulatory Visit (INDEPENDENT_AMBULATORY_CARE_PROVIDER_SITE_OTHER): Payer: 59 | Admitting: *Deleted

## 2017-02-20 ENCOUNTER — Emergency Department (HOSPITAL_COMMUNITY): Payer: 59

## 2017-02-20 DIAGNOSIS — C20 Malignant neoplasm of rectum: Secondary | ICD-10-CM | POA: Diagnosis present

## 2017-02-20 DIAGNOSIS — R0602 Shortness of breath: Secondary | ICD-10-CM | POA: Diagnosis not present

## 2017-02-20 DIAGNOSIS — R079 Chest pain, unspecified: Secondary | ICD-10-CM | POA: Diagnosis not present

## 2017-02-20 DIAGNOSIS — Z950 Presence of cardiac pacemaker: Secondary | ICD-10-CM | POA: Insufficient documentation

## 2017-02-20 DIAGNOSIS — I498 Other specified cardiac arrhythmias: Secondary | ICD-10-CM | POA: Insufficient documentation

## 2017-02-20 DIAGNOSIS — Z85048 Personal history of other malignant neoplasm of rectum, rectosigmoid junction, and anus: Secondary | ICD-10-CM | POA: Insufficient documentation

## 2017-02-20 DIAGNOSIS — R0789 Other chest pain: Principal | ICD-10-CM | POA: Insufficient documentation

## 2017-02-20 DIAGNOSIS — I495 Sick sinus syndrome: Secondary | ICD-10-CM

## 2017-02-20 LAB — BASIC METABOLIC PANEL
Anion gap: 11 (ref 5–15)
BUN: 11 mg/dL (ref 6–20)
CALCIUM: 9.8 mg/dL (ref 8.9–10.3)
CO2: 26 mmol/L (ref 22–32)
Chloride: 101 mmol/L (ref 101–111)
Creatinine, Ser: 0.87 mg/dL (ref 0.44–1.00)
Glucose, Bld: 102 mg/dL — ABNORMAL HIGH (ref 65–99)
Potassium: 3.6 mmol/L (ref 3.5–5.1)
SODIUM: 138 mmol/L (ref 135–145)

## 2017-02-20 LAB — CBC
HCT: 40.1 % (ref 36.0–46.0)
Hemoglobin: 13.4 g/dL (ref 12.0–15.0)
MCH: 30.4 pg (ref 26.0–34.0)
MCHC: 33.4 g/dL (ref 30.0–36.0)
MCV: 90.9 fL (ref 78.0–100.0)
PLATELETS: 107 10*3/uL — AB (ref 150–400)
RBC: 4.41 MIL/uL (ref 3.87–5.11)
RDW: 14.1 % (ref 11.5–15.5)
WBC: 4.5 10*3/uL (ref 4.0–10.5)

## 2017-02-20 LAB — HEMOGLOBIN A1C
HEMOGLOBIN A1C: 5.5 % (ref 4.8–5.6)
Mean Plasma Glucose: 111.15 mg/dL

## 2017-02-20 LAB — CK: CK TOTAL: 84 U/L (ref 38–234)

## 2017-02-20 LAB — I-STAT TROPONIN, ED: TROPONIN I, POC: 0 ng/mL (ref 0.00–0.08)

## 2017-02-20 LAB — TROPONIN I

## 2017-02-20 MED ORDER — ACETAMINOPHEN 325 MG PO TABS: 650.0000 mg | ORAL_TABLET | Freq: Four times a day (QID) | ORAL | Status: DC | PRN

## 2017-02-20 MED ORDER — NITROGLYCERIN 0.4 MG SL SUBL: 0.4000 mg | SUBLINGUAL_TABLET | SUBLINGUAL | Status: DC | PRN

## 2017-02-20 MED ORDER — ALPRAZOLAM 0.5 MG PO TABS: 0.5000 mg | ORAL_TABLET | Freq: Two times a day (BID) | ORAL | Status: DC | PRN

## 2017-02-20 MED ORDER — ONDANSETRON HCL 4 MG PO TABS
4.0000 mg | ORAL_TABLET | Freq: Four times a day (QID) | ORAL | Status: DC | PRN
Start: 2017-02-20 — End: 2017-02-21

## 2017-02-20 MED ORDER — ASPIRIN 81 MG PO CHEW
324.0000 mg | CHEWABLE_TABLET | Freq: Once | ORAL | Status: AC
  Administered 2017-02-20: 324 mg via ORAL
  Filled 2017-02-20: qty 4

## 2017-02-20 MED ORDER — ASPIRIN 81 MG PO CHEW
81.0000 mg | CHEWABLE_TABLET | Freq: Every day | ORAL | Status: DC
  Administered 2017-02-21: 81 mg via ORAL
  Filled 2017-02-20: qty 1

## 2017-02-20 MED ORDER — EZETIMIBE 10 MG PO TABS
10.0000 mg | ORAL_TABLET | Freq: Every day | ORAL | Status: DC
  Filled 2017-02-20: qty 1

## 2017-02-20 MED ORDER — METOPROLOL TARTRATE 25 MG PO TABS
12.5000 mg | ORAL_TABLET | Freq: Two times a day (BID) | ORAL | Status: DC
  Filled 2017-02-20: qty 1

## 2017-02-20 MED ORDER — PSYLLIUM 95 % PO PACK
1.0000 | PACK | Freq: Every day | ORAL | Status: DC
  Administered 2017-02-21: 1 via ORAL
  Filled 2017-02-20 (×4): qty 1

## 2017-02-20 MED ORDER — ENSURE ENLIVE PO LIQD
237.0000 mL | Freq: Two times a day (BID) | ORAL | Status: DC
  Administered 2017-02-21: 237 mL via ORAL

## 2017-02-20 MED ORDER — ACETAMINOPHEN 650 MG RE SUPP: 650.0000 mg | Freq: Four times a day (QID) | RECTAL | Status: DC | PRN

## 2017-02-20 MED ORDER — OXYBUTYNIN CHLORIDE ER 5 MG PO TB24
10.0000 mg | ORAL_TABLET | Freq: Every day | ORAL | Status: DC
  Administered 2017-02-21: 10 mg via ORAL
  Filled 2017-02-20: qty 2

## 2017-02-20 MED ORDER — TRAZODONE HCL 50 MG PO TABS
100.0000 mg | ORAL_TABLET | Freq: Every day | ORAL | Status: DC
  Filled 2017-02-20: qty 2

## 2017-02-20 MED ORDER — ONDANSETRON HCL 4 MG/2ML IJ SOLN: 4.0000 mg | Freq: Four times a day (QID) | INTRAMUSCULAR | Status: DC | PRN

## 2017-02-20 MED ORDER — ENOXAPARIN SODIUM 40 MG/0.4ML ~~LOC~~ SOLN: 40.0000 mg | SUBCUTANEOUS | Status: DC

## 2017-02-20 MED ORDER — IBUPROFEN 800 MG PO TABS: 800.0000 mg | ORAL_TABLET | Freq: Four times a day (QID) | ORAL | Status: DC | PRN

## 2017-02-20 NOTE — Progress Notes (Signed)
Remote pacemaker transmission.   

## 2017-02-20 NOTE — H&P (Addendum)
TRH H&P   Patient Demographics:    Robin Wolfe, is a 61 y.o. female  MRN: [id number]   DOB - 02-May-1956  Admit Date - 02/20/2017  Outpatient Primary MD for the patient is Celene Squibb, MD  Referring MD: Dr Sabra Heck  Outpatient Specialists:  Dr Caryl Comes  Patient coming from: home  Chief Complaint  Patient presents with  . Chest Pain      HPI:    Robin Wolfe  is a 61 y.o. female, With history of colon cancer status post resection, followed by chemotherapy and radiation currently on remission, sinus node dysfunction with symptomatic bradycardia status post pacemaker in 2016. She also had abnormal Myoview at that time and underwent left heart catheterization showing normal LV dysfunction and nonobstructive coronary artery disease. Patient was in her office today and getting her semiannual pacemaker check done with a phone and sending them to Dr. Olin Pia office when she started having left-sided chest pain which was sharp and pressure-like 9/10 in severity lasting for almost 35-40 minutes without any aggravating or relieving factors. The pain radiated to her right jaw and ear and into her bilateral hands. She had associated mild shortness of breath but no palpitations, dizziness or lightheadedness. Denies any headache, blurred vision, nausea, vomiting, abdominal pain, bowel or urinary symptoms. Denies similar symptoms in the past. Denies recent illness, change in medications or travel.  Symptoms resolved after arriving to the ED and receiving 4 baby aspirin.  In the ED vitals were stable except for mildly elevated blood pressure of 151/88 mmHg. Blood work showed normal CBC, hemoglobin, platelets of 107 and normal chemistry. I-STAT troponin was negative. EKG showed atrial paced rhythm with LBBB.  Hospital consulted for observation on telemetry to rule out ACS.   Review of systems:    In  addition to the HPI above,  No Fever-chills, No Headache, No changes with Vision or hearing, No problems swallowing food or Liquids, Chest pain +, shortness of breath, +, no cough No Abdominal pain, No Nausea or vomiting, Bowel movements are regular, No Blood in stool or Urine, No dysuria, No new skin rashes or bruises, No new joints pains-aches,  No new weakness, tingling, numbness in any extremity, No recent weight gain or loss, No polyuria, polydypsia or polyphagia, No significant Mental Stressors.  A full 10 point Review of Systems was done, except as stated above, all other Review of Systems were negative.   With Past History of the following :    Past Medical History:  Diagnosis Date  . Hypercholesteremia   . Insomnia   . 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 S/P placement of cardiac pacemaker, 02/08/15, Biotronik, MIR compatibile  02/09/2015  . Symptomatic sinus bradycardia 02/09/2015  . Thrombocytopenia ([place])   . Ventral hernia       Past Surgical History:  Procedure  Laterality Date  . ABDOMINAL HYSTERECTOMY     endometriosis  . CARDIAC CATHETERIZATION N/A 01/16/2015   Procedure: Left Heart Cath and Coronary Angiography;  Surgeon: Burnell Blanks, MD;  Location: [facility];  Service: Cardiovascular;  Laterality: N/A;  . CEREBRAL ANEURYSM REPAIR    . CHOLECYSTECTOMY     ileostomyw/ takedown 8/09 Dr Crisoforo Oxford  . COLON SURGERY    . 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 COLONOSCOPY  09/12/2008    Small rectal pouch, which made retroflexion challenging/ no polyps detected in the rectum/ No diverticula  . COLONOSCOPY  01/21/2012   Procedure: COLONOSCOPY;  Surgeon: Danie Binder, MD;  [facility];  Service:  Endoscopy;  Laterality: N/A;  . EP IMPLANTABLE DEVICE N/A 02/08/2015   Procedure: Pacemaker Implant;  Surgeon: Deboraha Sprang, MD;  Location: [facility];  Service: Cardiovascular;  Laterality: N/A;  . SIGMOIDOSCOPY  12/24/2006   rectal adenocarcinoma/Normal retroflexed view of the rectum  . VENTRAL HERNIA REPAIR        Social History:     Social History  Substance Use Topics  . Smoking status: Former Smoker    Packs/day: 1.50    Years: 8.00    Types: Cigarettes    Quit date: 01/06/2007  . Smokeless tobacco: Never Used  . Alcohol use 0.0 oz/week     Comment: rarely     Lives - At home  Mobility - independent   Family History :     Family History  Problem Relation Age of Onset  . Heart disease Unknown   . Lung disease Unknown   . Diabetes Unknown   . Colon cancer Neg Hx       Home Medications:   Prior to Admission medications   Medication Sig Start Date End Date Taking? Authorizing Provider  ALPRAZolam Duanne Moron) 0.5 MG tablet Take 0.5 mg by mouth 2 (two) times daily as needed.  02/07/17  Yes [provider]  cephALEXin (KEFLEX) 500 MG capsule Take 1 capsule by mouth 2 (two) times daily. 02/17/17  Yes [provider]  escitalopram (LEXAPRO) 5 MG tablet Take 1 tablet by mouth daily. 02/04/17  Yes [provider]  ezetimibe (ZETIA) 10 MG tablet Take 1 tablet (10 mg total) by mouth daily. 06/26/15  Yes Skeet Latch, MD  ibuprofen (ADVIL,MOTRIN) 200 MG tablet Take 800 mg by mouth every 6 (six) hours as needed.   Yes [provider]  oxybutynin (DITROPAN-XL) 10 MG 24 hr tablet Take 1 tablet by mouth daily. 02/04/17  Yes [provider]  psyllium (METAMUCIL) 58.6 % powder Take 1 packet by mouth daily.   Yes [provider]  traZODone (DESYREL) 100 MG tablet Take 1 tablet by mouth at bedtime.  01/28/17  Yes [provider]  triamcinolone (KENALOG) 0.025 % cream Apply 1 application topically 2 (two) times daily.   02/17/17  Yes [provider]     Allergies:     Allergies  Allergen Reactions  . Compazine Other (See Comments)    Reaction: seizures  . Hydromorphone Hcl Other (See Comments)    REACTION: hallucinations and behavior issues     Physical Exam:   Vitals  Blood pressure (!) 151/88, pulse 74, temperature (!) 97 F (36.1 C), temperature source Oral,  resp. rate 16, height 5\' 4"  (1.626 m), weight 69.4 kg (153 lb), SpO2 99 %.    General: Middle aged female lying in bed not in distress HEENT: No pallor, no icterus, moist oral mucosa, supple neck, no JVD Chest: Clear to auscultation bilaterally, no added sounds CVS: Reproducible pain on pressure over left lower substernal area, normal S1 and S2, no murmurs rub or gallop GI: Soft, nondistended, nontender, bowel sounds present Musculoskeletal: Warm, no edema  CNS: Alert and oriented    Data Review:    CBC  Recent Labs Lab 02/20/17 1040  WBC 4.5  HGB 13.4  HCT 40.1  PLT 107*  MCV 90.9  MCH 30.4  MCHC 33.4  RDW 14.1   ------------------------------------------------------------------------------------------------------------------  Chemistries   Recent Labs Lab 02/20/17 1040  NA 138  K 3.6  CL 101  CO2 26  GLUCOSE 102*  BUN 11  CREATININE 0.87  CALCIUM 9.8   ------------------------------------------------------------------------------------------------------------------ estimated creatinine clearance is 66.6 mL/min (by C-G formula based on SCr of 0.87 mg/dL). ------------------------------------------------------------------------------------------------------------------ No results for input(s): TSH, T4TOTAL, T3FREE, THYROIDAB in the last 72 hours.  Invalid input(s): FREET3  Coagulation profile No results for input(s): INR, PROTIME in the last 168 hours. ------------------------------------------------------------------------------------------------------------------- No results for input(s):  DDIMER in the last 72 hours. -------------------------------------------------------------------------------------------------------------------  Cardiac Enzymes No results for input(s): CKMB, TROPONINI, MYOGLOBIN in the last 168 hours.  Invalid input(s): CK ------------------------------------------------------------------------------------------------------------------ No results found for: BNP   ---------------------------------------------------------------------------------------------------------------  Urinalysis No results found for: COLORURINE, APPEARANCEUR, LABSPEC, [place], GLUCOSEU, HGBUR, BILIRUBINUR, KETONESUR, PROTEINUR, UROBILINOGEN, NITRITE, LEUKOCYTESUR  ----------------------------------------------------------------------------------------------------------------   Imaging Results:    Dg Chest 2 View  Result Date: 02/20/2017 CLINICAL DATA:  Central chest pain beginning this morning. Chest pressure and shortness of breath. EXAM: CHEST  2 VIEW COMPARISON:  01/17/2016 FINDINGS: Heart size remains normal. Aortic atherosclerosis again noted. Dual lead pacemaker appears unchanged. Pulmonary vascularity is normal. There is chronic pleural and parenchymal scarring at the lung apices. Lungs otherwise clear without infiltrate, effusion or collapse. No significant bone finding. IMPRESSION: No change.  No active cardiopulmonary disease. Electronically Signed   By: Nelson Chimes M.D.   On: 02/20/2017 11:37    My personal review of EKG: Paced rhythm with nonspecific intraventricular conduction delay (no new changes)    Assessment & Plan:    Principal Problem:   Chest pain At rest Has both typical and atypical symptoms. HEART score of 4.  Has history of nonobstructive CAD as per cardiac cath in 8016. Also on telemetry. Serial cardiac enzymes and EKG to rule out ACS. Add daily aspirin. Sublingual nitroglycerin when necessary. Add Low-dose metoprolol. Check lipid panel and A1c in  am. 2-D echo ordered. Cardiology consult for further recommendations.   Active Problems:   Rectal adenocarcinoma ([place]) In remission    S/P placement of cardiac pacemaker, 02/08/15, Biotronik, MIR compatibile  Follows with Dr. Caryl Comes. Had her semiannual exam of her pacemaker today and sent to Dr Caryl Comes.  Dyslipidemia Continue Zetia  Left leg cellulitis Small area secondary to recent spider bite. Currently on Keflex which I will continue.  DVT Prophylaxis: Lovenox  AM Labs Ordered, also please review Full Orders  Family Communication: Admission, patients condition and plan of care including tests being ordered have been discussed with the patient and her son at bedside .  Code Status full code  Likely DC to  home   Condition: Fair  Consults called: cardiology  Admission status: observation   Time spent in minutes :50  Louellen Molder M.D on 02/20/2017 at 12:46 PM  Between 7am to 7pm - Pager - [phone]. After 7pm go to [email or web address] - password [facility]  [facility] - Office  [phone]

## 2017-02-20 NOTE — ED Provider Notes (Signed)
[facility] DEPT Provider Note   CSN: [id number] Arrival date & time: 02/20/17  1029     History   Chief Complaint Chief Complaint  Patient presents with  . Chest Pain    HPI Robin Wolfe is a 61 y.o. female.  HPI  The patient is a 61 year old female, she has a known history of colon cancer status post resection, chemotherapy and radiation in current remission. She also has a history of sinus node dysfunction and was treated with a pacemaker 2 years ago. She presents to the hospital today with chest pain. She had previously been evaluated for chest pain one year ago at which time she had a stress test with some ST depressions, she underwent a heart catheterization which showed a 30% proximal LAD otherwise unremarkable heart catheterization. She reports that while she was at work today she was in the break room shaking or pacemaker for a routine checkup when she developed chest pain described as central, pressure, sharp, radiation to the jaw, associated tingling in the hands. This has been fluctuating in intensity and is not associated with shortness of breath swelling of the legs fevers chills or coughing. She is currently having the symptoms.  Past Medical History:  Diagnosis Date  . Hypercholesteremia   . Insomnia   . 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 S/P placement of cardiac pacemaker, 02/08/15, Biotronik, MIR compatibile  02/09/2015  . Symptomatic sinus bradycardia 02/09/2015  . Thrombocytopenia ([place])   . Ventral hernia     Patient Active Problem List   Diagnosis Date Noted  . Symptomatic sinus bradycardia 02/09/2015  . S/P placement of cardiac pacemaker, 02/08/15, Biotronik, MIR compatibile  02/09/2015  . Sinus node dysfunction ([place]) 02/08/2015  . Abnormal cardiovascular function   . Insomnia   . Rectal adenocarcinoma ([place])   . Radial head fracture, closed 12/31/2010  . Pain in left elbow 12/31/2010  . ADENOCARCINOMA,  RECTOSIGMOID COLON 09/07/2008    Past Surgical History:  Procedure Laterality Date  . ABDOMINAL HYSTERECTOMY     endometriosis  . CARDIAC CATHETERIZATION N/A 01/16/2015   Procedure: Left Heart Cath and Coronary Angiography;  Surgeon: Burnell Blanks, MD;  Location: [facility];  Service: Cardiovascular;  Laterality: N/A;  . CEREBRAL ANEURYSM REPAIR    . CHOLECYSTECTOMY     ileostomyw/ takedown 8/09 Dr Crisoforo Oxford  . COLON SURGERY    . 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 COLONOSCOPY  09/12/2008    Small rectal pouch, which made retroflexion challenging/ no polyps detected in the rectum/ No diverticula  . COLONOSCOPY  01/21/2012   Procedure: COLONOSCOPY;  Surgeon: Danie Binder, MD;  [facility];  Service: Endoscopy;  Laterality: N/A;  . EP IMPLANTABLE DEVICE N/A 02/08/2015   Procedure: Pacemaker Implant;  Surgeon: Deboraha Sprang, MD;  Location: [facility];  Service: Cardiovascular;  Laterality: N/A;  . SIGMOIDOSCOPY  12/24/2006   rectal adenocarcinoma/Normal retroflexed view of the rectum  . VENTRAL HERNIA REPAIR      OB History    No data available       Home Medications    Prior to Admission medications   Medication Sig Start Date End Date Taking? Authorizing Provider  ALPRAZolam Duanne Moron) 0.5 MG tablet Take 0.5  mg by mouth 2 (two) times daily as needed.  02/07/17  Yes [provider]  cephALEXin (KEFLEX) 500 MG capsule Take 1 capsule by mouth 2 (two) times daily. 02/17/17  Yes [provider]  escitalopram (LEXAPRO) 5 MG tablet Take 1 tablet by mouth daily. 02/04/17  Yes [provider]  ezetimibe (ZETIA) 10 MG tablet Take 1 tablet (10 mg total) by mouth daily. 06/26/15  Yes Skeet Latch, MD  ibuprofen (ADVIL,MOTRIN)  200 MG tablet Take 800 mg by mouth every 6 (six) hours as needed.   Yes [provider]  oxybutynin (DITROPAN-XL) 10 MG 24 hr tablet Take 1 tablet by mouth daily. 02/04/17  Yes [provider]  psyllium (METAMUCIL) 58.6 % powder Take 1 packet by mouth daily.   Yes [provider]  traZODone (DESYREL) 100 MG tablet Take 1 tablet by mouth at bedtime.  01/28/17  Yes [provider]  triamcinolone (KENALOG) 0.025 % cream Apply 1 application topically 2 (two) times daily.  02/17/17  Yes [provider]    Family History Family History  Problem Relation Age of Onset  . Heart disease Unknown   . Lung disease Unknown   . Diabetes Unknown   . Colon cancer Neg Hx     Social History Social History  Substance Use Topics  . Smoking status: Former Smoker    Packs/day: 1.50    Years: 8.00    Types: Cigarettes    Quit date: 01/06/2007  . Smokeless tobacco: Never Used  . Alcohol use 0.0 oz/week     Comment: rarely     Allergies   Compazine and Hydromorphone hcl   Review of Systems Review of Systems  All other systems reviewed and are negative.    Physical Exam Updated Vital Signs BP (!) 151/88   Pulse 74   Temp (!) 97 F (36.1 C) (Oral)   Resp 16   Ht 5\' 4"  (1.626 m)   Wt 69.4 kg (153 lb)   SpO2 99%   BMI 26.26 kg/m   Physical Exam  Constitutional: She appears well-developed and well-nourished. No distress.  HENT:  Head: Normocephalic and atraumatic.  Mouth/Throat: Oropharynx is clear and moist. No oropharyngeal exudate.  Eyes: Pupils are equal, round, and reactive to light. Conjunctivae and EOM are normal. Right eye exhibits no discharge. Left eye exhibits no discharge. No scleral icterus.  Neck: Normal range of motion. Neck supple. No JVD present. No thyromegaly present.  Cardiovascular: Normal rate, regular rhythm, normal heart sounds and intact distal pulses.  Exam reveals no gallop and no friction rub.   No murmur  heard. Pulmonary/Chest: Effort normal and breath sounds normal. No respiratory distress. She has no wheezes. She has no rales.  Abdominal: Soft. Bowel sounds are normal. She exhibits no distension and no mass. There is no tenderness.  Musculoskeletal: Normal range of motion. She exhibits no edema or tenderness.  Lymphadenopathy:    She has no cervical adenopathy.  Neurological: She is alert. Coordination normal.  Skin: Skin is warm and dry. No rash noted. No erythema.  Psychiatric: She has a normal mood and affect. Her behavior is normal.  Nursing note and vitals reviewed.    ED Treatments / Results  Labs (all labs ordered are listed, but only abnormal results are displayed) Labs Reviewed  BASIC METABOLIC PANEL - Abnormal; Notable for the following:       Result Value   Glucose, Bld 102 (*)    All other components within normal  limits  CBC - Abnormal; Notable for the following:    Platelets 107 (*)    All other components within normal limits  I-STAT TROPONIN, ED    EKG  EKG Interpretation  Date/Time:  Thursday February 20 2017 10:44:36 EDT Ventricular Rate:  66 PR Interval:    QRS Duration: 122 QT Interval:  463 QTC Calculation: 486 R Axis:   -21 Text Interpretation:  Atrial-paced rhythm Nonspecific intraventricular conduction delay since last tracing no significant change Confirmed by Noemi Chapel [phone]) on 02/20/2017 11:18:52 AM       Radiology Dg Chest 2 View  Result Date: 02/20/2017 CLINICAL DATA:  Central chest pain beginning this morning. Chest pressure and shortness of breath. EXAM: CHEST  2 VIEW COMPARISON:  01/17/2016 FINDINGS: Heart size remains normal. Aortic atherosclerosis again noted. Dual lead pacemaker appears unchanged. Pulmonary vascularity is normal. There is chronic pleural and parenchymal scarring at the lung apices. Lungs otherwise clear without infiltrate, effusion or collapse. No significant bone finding. IMPRESSION: No change.  No active  cardiopulmonary disease. Electronically Signed   By: Nelson Chimes M.D.   On: 02/20/2017 11:37    Procedures Procedures (including critical care time)  Medications Ordered in ED Medications  aspirin chewable tablet 324 mg (324 mg Oral Given 02/20/17 1140)     Initial Impression / Assessment and Plan / ED Course  I have reviewed the triage vital signs and the nursing notes.  Pertinent labs & imaging results that were available during my care of the patient were reviewed by me and considered in my medical decision making (see chart for details).     The patient is a paced rhythm on her EKG, her exam is unremarkable with equal pulses at the radial arteries, a blood pressure of 683 systolic and no edema or JVD. There is some concern that this could be coronary disease given the symptoms however she has no acute ischemia on her EKG and a normal troponin. At this time the patient will likely need to be evaluated and observed in the hospital overnight. I will discuss this with cardiology.  Chest pain is ongoing, no objective findings of acute ischemia including a normal troponin and a nonischemic EKG however the patient is continuing to have symptoms with radiation to the jaw as well as the hands. I discussed the patient's care with the hospitalist will admit for observation overnight.  Final Clinical Impressions(s) / ED Diagnoses   Final diagnoses:  Chest pain, unspecified type    New Prescriptions New Prescriptions   No medications on file     Noemi Chapel, MD 02/20/17 1240

## 2017-02-20 NOTE — ED Triage Notes (Signed)
Pt comes in with central chest pain starting around 1015. Pt states she was interrogating her pacemaker and began having the pressure in her chest accompanied by sob. She began having bilateral hand numbness and tingling as well. Pt is rapidly breathing, encouraged to take slow deep breaths.

## 2017-02-21 ENCOUNTER — Observation Stay (HOSPITAL_BASED_OUTPATIENT_CLINIC_OR_DEPARTMENT_OTHER): Payer: 59

## 2017-02-21 ENCOUNTER — Encounter: Payer: Self-pay | Admitting: Cardiology

## 2017-02-21 ENCOUNTER — Encounter (HOSPITAL_COMMUNITY): Payer: Self-pay | Admitting: Cardiology

## 2017-02-21 DIAGNOSIS — R072 Precordial pain: Secondary | ICD-10-CM

## 2017-02-21 DIAGNOSIS — I495 Sick sinus syndrome: Secondary | ICD-10-CM

## 2017-02-21 DIAGNOSIS — Z85048 Personal history of other malignant neoplasm of rectum, rectosigmoid junction, and anus: Secondary | ICD-10-CM | POA: Diagnosis not present

## 2017-02-21 DIAGNOSIS — R0789 Other chest pain: Secondary | ICD-10-CM

## 2017-02-21 DIAGNOSIS — Z950 Presence of cardiac pacemaker: Secondary | ICD-10-CM

## 2017-02-21 DIAGNOSIS — I498 Other specified cardiac arrhythmias: Secondary | ICD-10-CM | POA: Diagnosis not present

## 2017-02-21 DIAGNOSIS — R079 Chest pain, unspecified: Secondary | ICD-10-CM

## 2017-02-21 LAB — LIPID PANEL
CHOL/HDL RATIO: 5.6 ratio
CHOLESTEROL: 189 mg/dL (ref 0–200)
HDL: 34 mg/dL — ABNORMAL LOW (ref >40)
LDL Cholesterol: 129 mg/dL — ABNORMAL HIGH (ref 0–99)
Triglycerides: 132 mg/dL (ref <150)
VLDL: 26 mg/dL (ref 0–40)

## 2017-02-21 LAB — ECHOCARDIOGRAM COMPLETE
HEIGHTINCHES: 64 in
WEIGHTICAEL: 2316.8 [oz_av]

## 2017-02-21 LAB — TROPONIN I

## 2017-02-21 LAB — HIV ANTIBODY (ROUTINE TESTING W REFLEX): HIV SCREEN 4TH GENERATION: NONREACTIVE

## 2017-02-21 MED ORDER — CEPHALEXIN 500 MG PO CAPS
500.0000 mg | ORAL_CAPSULE | Freq: Two times a day (BID) | ORAL | Status: DC
  Administered 2017-02-21: 500 mg via ORAL
  Filled 2017-02-21: qty 1

## 2017-02-21 NOTE — Consult Note (Signed)
Cardiology Consultation:   Patient ID: Robin Wolfe; [id number]; [birth year]   Admit date: 02/20/2017 Date of Consult: 02/21/2017  Primary Care Provider: Celene Squibb, MD Primary Cardiologist: Dr. Skeet Wolfe  Primary Electrophysiologist:  Dr. Virl Wolfe    Patient Profile:   Robin Wolfe is a 61 y.o. female with a history of sinus node dysfunction with component of autonomic insufficiency s/p PPM (Biotronik), non-obstructive CAD as of 2016, thrombocytopenia, hypercholesterolemia, depression and anxiety, who is now being seen today for the evaluation of chest pain at the request of Robin Wolfe, Hospitalist.  History of Present Illness:   Robin Wolfe presented to the ER with complaints of chest pain. She is a Merchandiser, retail in [place]. She has just returned to the office after delivery medications to the patients in their home, and sat down to have remote PPM interrogation. She states she was waiting for it to be completed when she has sudden onset of severe chest pressure and sharp substernal pain radiating up into her neck. This was associated with dyspnea. No diaphoresis, dizziness, or near syncope. Her boss brought her to ER. Pain lasted approximately 45 minutes in total, including time in ER.   On arrival to ER, BP 147/78, HR 65 bpm, O2 Sat 100% afebrile. EKG documented atrial pacing. Troponin negative X 3 (<0.03). Pertinent labs include K+ 3.6, PLTs 107, CXR did not demonstrate pneumonia or CHF.  She was treated with ASA and admitted for observation.   Only new medications are Lexapro and Xanax, as she is currently suffering from situational depression with the death of her husband from lung cancer in May of this year. Also reports job related stress. She has had no further chest pain under observation.  Past Medical History:  Diagnosis Date  . Hypercholesteremia   . Insomnia   . Rectal adenocarcinoma ([place]) 2008   Stage III, T2N1M0, s/p resection/chemo  . S/P  placement of cardiac pacemaker, 02/08/15, Biotronik, MIR compatibile  02/09/2015  . Symptomatic sinus bradycardia 02/09/2015  . Thrombocytopenia ([place])   . Ventral hernia     Past Surgical History:  Procedure Laterality Date  . ABDOMINAL HYSTERECTOMY     endometriosis  . CARDIAC CATHETERIZATION N/A 01/16/2015   Procedure: Left Heart Cath and Coronary Angiography;  Surgeon: Burnell Blanks, MD;  Location: [facility];  Service: Cardiovascular;  Laterality: N/A;  . CEREBRAL ANEURYSM REPAIR    . CHOLECYSTECTOMY     ileostomyw/ takedown 8/09 Dr Crisoforo Oxford  . COLON SURGERY    . 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 COLONOSCOPY  09/12/2008    Small rectal pouch, which made retroflexion challenging/ no polyps detected in the rectum/ No diverticula  . COLONOSCOPY  01/21/2012   Procedure: COLONOSCOPY;  Surgeon: Danie Binder, MD;  Location: [facility];  Service: Endoscopy;  Laterality: N/A;  . EP IMPLANTABLE DEVICE N/A 02/08/2015   Procedure: Pacemaker Implant;  Surgeon: Deboraha Sprang, MD;  Location: [facility];  Service: Cardiovascular;  Laterality: N/A;  . SIGMOIDOSCOPY  12/24/2006   rectal adenocarcinoma/Normal retroflexed view of the rectum  . VENTRAL HERNIA REPAIR       Home Medications:  Prior to Admission medications   Medication Sig Start Date End Date Taking? Authorizing Provider  ALPRAZolam Duanne Moron) 0.5  MG tablet Take 0.5 mg by mouth 2 (two) times daily as needed.  02/07/17  Yes [provider]  cephALEXin (KEFLEX) 500 MG capsule Take 1 capsule by mouth 2 (two) times daily. 02/17/17  Yes [provider]  escitalopram (LEXAPRO) 5 MG tablet Take 1 tablet by mouth daily. 02/04/17  Yes [provider]  ezetimibe (ZETIA) 10 MG tablet Take 1 tablet  (10 mg total) by mouth daily. 06/26/15  Yes Robin Latch, MD  ibuprofen (ADVIL,MOTRIN) 200 MG tablet Take 800 mg by mouth every 6 (six) hours as needed.   Yes [provider]  oxybutynin (DITROPAN-XL) 10 MG 24 hr tablet Take 1 tablet by mouth daily. 02/04/17  Yes [provider]  psyllium (METAMUCIL) 58.6 % powder Take 1 packet by mouth daily.   Yes [provider]  traZODone (DESYREL) 100 MG tablet Take 1 tablet by mouth at bedtime.  01/28/17  Yes [provider]  triamcinolone (KENALOG) 0.025 % cream Apply 1 application topically 2 (two) times daily.  02/17/17  Yes [provider]    Inpatient Medications: Scheduled Meds: . aspirin  81 mg Oral Daily  . cephALEXin  500 mg Oral BID  . enoxaparin (LOVENOX) injection  40 mg Subcutaneous Q24H  . ezetimibe  10 mg Oral QHS  . feeding supplement (ENSURE ENLIVE)  237 mL Oral BID BM  . metoprolol tartrate  12.5 mg Oral BID  . oxybutynin  10 mg Oral Daily  . psyllium  1 packet Oral Daily  . traZODone  100 mg Oral QHS     PRN Meds: acetaminophen **OR** acetaminophen, ALPRAZolam, ibuprofen, nitroGLYCERIN, ondansetron **OR** ondansetron (ZOFRAN) IV  Allergies:    Allergies  Allergen Reactions  . Compazine Other (See Comments)    Reaction: seizures  . Hydromorphone Hcl Other (See Comments)    REACTION: hallucinations and behavior issues    Social History:   Social History   Social History  . Marital status: Widowed    Spouse name: N/A  . Number of children: N/A  . Years of education: N/A   Occupational History  . Not on file.   Social History Main Topics  . Smoking status: Former Smoker    Packs/day: 1.50    Years: 8.00    Types: Cigarettes    Quit date: 01/06/2007  . Smokeless tobacco: Never Used  . Alcohol use 0.0 oz/week     Comment: rarely  . Drug use: No  . Sexual activity: Yes    Birth control/ protection: Surgical   Other Topics Concern  . Not on file   Social History  Narrative  . No narrative on file    Family History:    Family History  Problem Relation Age of Onset  . Heart disease Unknown   . Lung disease Unknown   . Diabetes Unknown   . Colon cancer Neg Hx      ROS:  Please see the history of present illness.  ROS  No fevers or chills. No cough or hemoptysis. No nausea or emesis. All other ROS reviewed and negative.     Physical Exam/Data:   Vitals:   02/20/17 1500 02/20/17 1735 02/20/17 2116 02/21/17 0520  BP: 115/82 119/75 105/64 103/80  Pulse: 66 68 69 70  Resp: 19  18 16   Temp:  97.7 F (36.5 C) 98.2 F (36.8 C) 98.3 F (36.8 C)  TempSrc:  Oral Oral Oral  SpO2: 99% 100% 97% 97%  Weight:  144 lb 12.8 oz (  65.7 kg)    Height:  5' 4"  (1.626 m)      Intake/Output Summary (Last 24 hours) at 02/21/17 0953 Last data filed at 02/21/17 0821  Gross per 24 hour  Intake              600 ml  Output              750 ml  Net             -150 ml   Filed Weights   02/20/17 1037 02/20/17 1735  Weight: 153 lb (69.4 kg) 144 lb 12.8 oz (65.7 kg)   Body mass index is 24.85 kg/m.   General:  Well nourished, well developed, in no acute distress HEENT: normal Lymph: no adenopathy Neck: no JVD Endocrine:  No thryomegaly Vascular: No carotid bruits; FA pulses 2+ bilaterally without bruits  Cardiac:  normal S1, S2; RRR; no murmur  Lungs:  Clear to auscultation bilaterally, no wheezing, rhonchi or rales  Abd: soft, nontender, no hepatomegaly  Ext: no edema Musculoskeletal:  No deformities, BUE and BLE strength normal and equal Skin: warm and dry  Neuro:  CNs 2-12 intact, no focal abnormalities noted Psych:  Normal affect   EKG:  The EKG was personally reviewed and demonstrates:  Atrial pacing with IVCD.  Telemetry:  Telemetry was personally reviewed and demonstrates:  Atrial pacing.  Relevant CV Studies: Cardiac Cath 01/16/2015 Procedures   Left Heart Cath and Coronary Angiography  Conclusion    Prox LAD lesion, 30%  stenosed.  1st Diag lesion, 30% stenosed.  The left ventricular systolic function is normal.   1. Mild non-obstructive CAD 2. Normal LV systolic function   PPM Implant 02/08/2015 Medtronic MRI compatible 5076 ventricular lead serial [id number] and a Medtronic MRI compatible atrial lead serial number [id number]. The leads were affixed to the prepectoral fascia and attached to a BIOTRONIK MIR COMPATIBILE pulse generator serial number [id number].    Laboratory Data:  Chemistry Recent Labs Lab 02/20/17 1040  NA 138  K 3.6  CL 101  CO2 26  GLUCOSE 102*  BUN 11  CREATININE 0.87  CALCIUM 9.8  GFRNONAA >60  GFRAA >60  ANIONGAP 11    Hematology Recent Labs Lab 02/20/17 1040  WBC 4.5  RBC 4.41  HGB 13.4  HCT 40.1  MCV 90.9  MCH 30.4  MCHC 33.4  RDW 14.1  PLT 107*   Cardiac Enzymes Recent Labs Lab 02/20/17 1733 02/21/17 0019 02/21/17 0514  TROPONINI <0.03 <0.03 <0.03    Recent Labs Lab 02/20/17 1045  TROPIPOC 0.00    Radiology/Studies:  Dg Chest 2 View  Result Date: 02/20/2017 CLINICAL DATA:  Central chest pain beginning this morning. Chest pressure and shortness of breath. EXAM: CHEST  2 VIEW COMPARISON:  01/17/2016 FINDINGS: Heart size remains normal. Aortic atherosclerosis again noted. Dual lead pacemaker appears unchanged. Pulmonary vascularity is normal. There is chronic pleural and parenchymal scarring at the lung apices. Lungs otherwise clear without infiltrate, effusion or collapse. No significant bone finding. IMPRESSION: No change.  No active cardiopulmonary disease. Electronically Signed   By: Nelson Chimes M.D.   On: 02/20/2017 11:37    Assessment and Plan:   1. Chest pain: Pain with typical and atypical features. EKG is non-diagnostic, troponin negative X 3. Most recent cardiac cath in 2016 only revealed 30% proximal LAD and 30% lst diagonal. No recurrence under observation. Do not anticipate further inpatient ischemic testing at this time.  2.  PPM in  situ: Just had remote interrogation performed with follow-up pending per Dr. Olin Pia review.  3. Situational Depression: Newly placed on antidepressant since May. Followed by PCP.   4. Thrombocytopenia: Platelets noted at 107 this admission. No active bleeding. Recommend hematology evaluation as OP if clinically warranted.    For questions or updates, please contact [facility] Please consult [email or web address] for contact info under Cardiology/STEMI.   Signed, Jory Sims, NP  02/21/2017 9:53 AM    Attending note:   Patient seen and examined. I reviewed her records and modified the above note by Ms. West Pugh. Ms. Montee presents after episode of sudden onset chest pain described as both sharp and pressure-like, lasted approximately one hour and happened to occur while having PPM remotely interrogated. She has had no palpitations or syncope. No recent reproducible exertional chest pain. Does have reflux symptoms and takes Prilosec.   On examination this morning she appears comfortable, has no further chest pain. Systolic blood pressure 071, heart rate in the 70s with atrial pacing by telemetry. Lungs are clear without labored breathing. Cardiac exam reveals RRR without gallop. No tenderness at pacemaker site. Troponin I levels are negative 3. ECG shows an atrial paced rhythm. Chest x-ray shows no acute process.  Episode of chest pain with typical and atypical features, no recurrence under observation. ECG shows atrial pacing with IVCD and troponin I levels are negative. She has a history of mild coronary atherosclerosis documented at cardiac catheterization within the last 2 years. At this point do not plan further inpatient cardiac ischemic testing. Would continue with current outpatient medical regimen. Please arrange follow-up with Dr. Oval Linsey following discharge for reevaluation of any recurring symptoms that may require further workup.  Satira Sark, M.D., F.A.C.C.

## 2017-02-21 NOTE — Discharge Summary (Signed)
Physician Discharge Summary  Robin Wolfe KNL:[id number] DOB: [birth year] DOA: 02/20/2017  PCP: Celene Squibb, MD  Admit date: 02/20/2017 Discharge date: 02/21/2017  Admitted From: Home Disposition:  Home  Recommendations for Outpatient Follow-up:  Follow up with PCP in 2 weeks.   follow-up with patent's cardiologist dr. Oval Linsey if  Symptoms reoccur in future. Follow-up 2-D echo results as outpatient.  Home Health: None Equipment/Devices: None  Discharge Condition: Fair CODE STATUS: Full code Diet recommendation: Regular    Discharge Diagnoses:  Principal Problem:   Atypical chest pain   Active Problems:   Rectal adenocarcinoma ([facility])   Sinus node dysfunction ([facility])   S/P placement of cardiac pacemaker, 02/08/15, Biotronik, MIR compatibile   Brief narrative stress history of present illness Robin Wolfe  is a 61 y.o. female, With history of colon cancer status post resection, followed by chemotherapy and radiation currently on remission, sinus node dysfunction with symptomatic bradycardia status post pacemaker in 2016. She also had abnormal Myoview at that time and underwent left heart catheterization showing normal LV dysfunction and nonobstructive coronary artery disease. Patient was in her office today and getting her semiannual pacemaker check done with a phone and sending them to Dr. Olin Pia office when she started having left-sided chest pain which was sharp and pressure-like 9/10 in severity lasting for almost 35-40 minutes without any aggravating or relieving factors. The pain radiated to her right jaw and ear and into her bilateral hands. She had associated mild shortness of breath but no palpitations, dizziness or lightheadedness. Denies any headache, blurred vision, nausea, vomiting, abdominal pain, bowel or urinary symptoms. Denies similar symptoms in the past. Denies recent illness, change in medications or travel.  Symptoms resolved after arriving to the ED and receiving 4  baby aspirin.  In the ED vitals were stable except for mildly elevated blood pressure of 151/88 mmHg. Blood work showed normal CBC, hemoglobin, platelets of 107 and normal chemistry. I-STAT troponin was negative. EKG showed atrial paced rhythm with LBBB.  Hospital consulted for observation on telemetry to rule out ACS.  Hospital course  Chest pain At rest Appears to be atypical and likely contributed by stress which patient is having both at work and at home. Stable on telemetry overnight. See detailed EKG and troponin unremarkable. Cardiology consult appreciated and suggests this is atypical symptoms. No further intervention needed. Patient has had mild coronary artery disease on cardiac cath 2 years ago. 2-D echo done and results can be followed as outpatient.  If symptoms be a patient may follow-up with her cardiologist Dr. Oval Linsey.  Active Problems:   Rectal adenocarcinoma ([place]) In remission    S/P placement of cardiac pacemaker, 02/08/15, Biotronik, MIR compatibile  Follows with Dr. Caryl Comes. Had her semiannual exam of her pacemaker today and sent to Dr Caryl Comes.  Dyslipidemia Continue Zetia  Left leg cellulitis Small area secondary to recent spider bite. Currently on Keflex which has been continued.  Consults: Cardiology Family communication: None at bedside  Disposition: Home  Discharge Instructions   Allergies as of 02/21/2017      Reactions   Compazine Other (See Comments)   Reaction: seizures   Hydromorphone Hcl Other (See Comments)   REACTION: hallucinations and behavior issues      Medication List    TAKE these medications   ALPRAZolam 0.5 MG tablet Commonly known as:  XANAX Take 0.5 mg by mouth 2 (two) times daily as needed.   cephALEXin 500 MG capsule Commonly known as:  KEFLEX Take 1  capsule by mouth 2 (two) times daily.   escitalopram 5 MG tablet Commonly known as:  LEXAPRO Take 1 tablet by mouth daily.   ezetimibe 10 MG tablet Commonly  known as:  ZETIA Take 1 tablet (10 mg total) by mouth daily.   ibuprofen 200 MG tablet Commonly known as:  ADVIL,MOTRIN Take 800 mg by mouth every 6 (six) hours as needed.   oxybutynin 10 MG 24 hr tablet Commonly known as:  DITROPAN-XL Take 1 tablet by mouth daily.   psyllium 58.6 % powder Commonly known as:  METAMUCIL Take 1 packet by mouth daily.   traZODone 100 MG tablet Commonly known as:  DESYREL Take 1 tablet by mouth at bedtime.   triamcinolone 0.025 % cream Commonly known as:  KENALOG Apply 1 application topically 2 (two) times daily.      Follow-up Information    Celene Squibb, MD Follow up in 2 week(s).   Specialty:  Internal Medicine Contact information: [place] [address] [phone]          Allergies  Allergen Reactions  . Compazine Other (See Comments)    Reaction: seizures  . Hydromorphone Hcl Other (See Comments)    REACTION: hallucinations and behavior issues       Procedures/Studies: Dg Chest 2 View  Result Date: 02/20/2017 CLINICAL DATA:  Central chest pain beginning this morning. Chest pressure and shortness of breath. EXAM: CHEST  2 VIEW COMPARISON:  01/17/2016 FINDINGS: Heart size remains normal. Aortic atherosclerosis again noted. Dual lead pacemaker appears unchanged. Pulmonary vascularity is normal. There is chronic pleural and parenchymal scarring at the lung apices. Lungs otherwise clear without infiltrate, effusion or collapse. No significant bone finding. IMPRESSION: No change.  No active cardiopulmonary disease. Electronically Signed   By: Nelson Chimes M.D.   On: 02/20/2017 11:37       Subjective: No further chest pain symptoms.  Discharge Exam: Vitals:   02/21/17 0520 02/21/17 1120  BP: 103/80   Pulse: 70   Resp: 16   Temp: 98.3 F (36.8 C)   SpO2: 97% 97%   Vitals:   02/20/17 1735 02/20/17 2116 02/21/17 0520 02/21/17 1120  BP: 119/75 105/64 103/80   Pulse: 68 69 70   Resp:  18 16   Temp:  97.7 F (36.5 C) 98.2 F (36.8 C) 98.3 F (36.8 C)   TempSrc: Oral Oral Oral   SpO2: 100% 97% 97% 97%  Weight: 65.7 kg (144 lb 12.8 oz)     Height: 5\' 4"  (1.626 m)       Gen.: Middle aged female not in distress HEENT: Moist mucosa, supple neck Chest: Clear bilaterally CVS: Normal S1 and S2, no murmurs, no reproducible pain on pressure GI: Soft, nondistended, nontender Musculoskeletal: Warm, no edema   The results of significant diagnostics from this hospitalization (including imaging, microbiology, ancillary and laboratory) are listed below for reference.     Microbiology: No results found for this or any previous visit (from the past 240 hour(s)).   Labs: BNP (last 3 results) No results for input(s): BNP in the last 8760 hours. Basic Metabolic Panel:  Recent Labs Lab 02/20/17 1040  NA 138  K 3.6  CL 101  CO2 26  GLUCOSE 102*  BUN 11  CREATININE 0.87  CALCIUM 9.8   Liver Function Tests: No results for input(s): AST, ALT, ALKPHOS, BILITOT, PROT, ALBUMIN in the last 168 hours. No results for input(s): LIPASE, AMYLASE in the last 168 hours. No results  for input(s): AMMONIA in the last 168 hours. CBC:  Recent Labs Lab 02/20/17 1040  WBC 4.5  HGB 13.4  HCT 40.1  MCV 90.9  PLT 107*   Cardiac Enzymes:  Recent Labs Lab 02/20/17 1647 02/20/17 1733 02/21/17 0019 02/21/17 0514  CKTOTAL 84  --   --   --   TROPONINI  --  <0.03 <0.03 <0.03   BNP: Invalid input(s): POCBNP CBG: No results for input(s): GLUCAP in the last 168 hours. D-Dimer No results for input(s): DDIMER in the last 72 hours. Hgb A1c  Recent Labs  02/20/17 1045  HGBA1C 5.5   Lipid Profile  Recent Labs  02/21/17 0514  CHOL 189  HDL 34*  LDLCALC 129*  TRIG 132  CHOLHDL 5.6   Thyroid function studies No results for input(s): TSH, T4TOTAL, T3FREE, THYROIDAB in the last 72 hours.  Invalid input(s): FREET3 Anemia work up No results for input(s): VITAMINB12, FOLATE, FERRITIN,  TIBC, IRON, RETICCTPCT in the last 72 hours. Urinalysis No results found for: COLORURINE, APPEARANCEUR, LABSPEC, [place], GLUCOSEU, HGBUR, BILIRUBINUR, KETONESUR, PROTEINUR, UROBILINOGEN, NITRITE, LEUKOCYTESUR Sepsis Labs Invalid input(s): PROCALCITONIN,  WBC,  LACTICIDVEN Microbiology No results found for this or any previous visit (from the past 240 hour(s)).   Time coordinating discharge: < 30 minutes  SIGNED:   Louellen Molder, MD  [facility] 02/21/2017, 12:32 PM Pager   If 7PM-7AM, please contact night-coverage [email or web address] Password [email or web address]

## 2017-02-21 NOTE — Progress Notes (Signed)
*  PRELIMINARY RESULTS* Echocardiogram 2D Echocardiogram has been performed.  Leavy Cella 02/21/2017, 12:52 PM

## 2017-02-21 NOTE — Progress Notes (Signed)
Nutrition Brief Note  Patient identified on the Malnutrition Screening Tool (MST) Report  Ms Tseng presents with complaint of chest pain. She has a hx of rectal cancer (2008), colon surgery and s/p cardiac pacemaker (2016).   Wt Readings from Last 15 Encounters:  02/20/17 144 lb 12.8 oz (65.7 kg)  05/22/16 153 lb (69.4 kg)  01/26/16 152 lb 3.2 oz (69 kg)  01/17/16 150 lb (68 kg)  05/16/15 149 lb (67.6 kg)  02/09/15 144 lb 6.4 oz (65.5 kg)  01/30/15 144 lb 12.8 oz (65.7 kg)  01/16/15 145 lb (65.8 kg)  01/12/15 145 lb (65.8 kg)  01/06/15 145 lb 12.8 oz (66.1 kg)  01/21/12 150 lb (68 kg)  12/31/11 150 lb 6.4 oz (68.2 kg)  11/04/11 140 lb (63.5 kg)  12/31/10 148 lb (67.1 kg)  09/13/09 133 lb (60.3 kg)   The patient reports her usual body weight has been ranging between 142-148 lbs. No acute wt changes based on Ms Goranson's recall.  Body mass index is 24.85 kg/m. Patient meets criteria for normal based on current BMI.   Her home diet is regular but limits fat and breads. Current diet order is Heart Healthy, patient is consuming approximately 25-50% of meals at this time. Last nights' poor intake related to food choices.  This morning she is eating pancakes and drinking her coffee.    Ms Kehres's meal intake pattern is 1 main meal daily (lunch) and snacks between as needed. She drinks almond milk at home and uses [place] polish sausage. Water is her primary beverage.   Labs and medications reviewed.  Recent Labs Lab 02/20/17 1040  NA 138  K 3.6  CL 101  CO2 26  BUN 11  CREATININE 0.87  CALCIUM 9.8  GLUCOSE 102*     No nutrition interventions warranted at this time. If nutrition issues arise, please consult RD.   Colman Cater MS,RD,CSG,LDN Office: [phone] Pager: [phone]

## 2017-02-24 NOTE — Discharge Instructions (Signed)
Return to Work ___________________________________________________ was treated at our facility. 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  Employee may return to modified work on ______________________. Work activity restrictions Work activities that are not tolerated include: ___Bending ___Prolonged sitting ___Lifting more than ________ lb ___Squatting ___ Prolonged standing ___Climbing ___Reaching ___Pushing and pulling ___ Walking ___Other ______________________ These restrictions are effective until ______________________. Show this Return to Work statement to your supervisor at work as soon as possible. Your employer should be aware of your condition and can help with the necessary work activity restrictions. If you wish to return to work sooner than the date that is listed above, or if you have further problems that make it difficult for you to return at that time, please call our clinic or your health care provider. _________________________________________ Health Care Provider Name (printed) _________________________________________ Health Care Provider (signature) _________________________________________ Date This information is not intended to replace advice given to you by your health care provider. Make sure you discuss any questions you have with your health care provider. Document Released: 05/20/2005 Document Revised: 05/03/2016 Document Reviewed: 12/17/2013 Elsevier Interactive Patient Education  [employer].

## 2017-03-06 DIAGNOSIS — F518 Other sleep disorders not due to a substance or known physiological condition: Secondary | ICD-10-CM | POA: Diagnosis not present

## 2017-03-18 LAB — CUP PACEART REMOTE DEVICE CHECK
Date Time Interrogation Session: 20181016155908
Implantable Lead Implant Date: 20160907
Implantable Lead Location: 753859
Implantable Lead Model: 5076
Lead Channel Setting Pacing Amplitude: 2 V
Lead Channel Setting Pacing Pulse Width: 0.4 ms
MDC IDC LEAD IMPLANT DT: 20160907
MDC IDC LEAD LOCATION: 753860
MDC IDC PG IMPLANT DT: 20160907
MDC IDC SET LEADCHNL RV PACING AMPLITUDE: 2.6 V
Pulse Gen Serial Number: 68620792

## 2017-05-16 DIAGNOSIS — E782 Mixed hyperlipidemia: Secondary | ICD-10-CM | POA: Diagnosis not present

## 2017-05-16 DIAGNOSIS — D509 Iron deficiency anemia, unspecified: Secondary | ICD-10-CM | POA: Diagnosis not present

## 2017-05-20 DIAGNOSIS — G4700 Insomnia, unspecified: Secondary | ICD-10-CM | POA: Diagnosis not present

## 2017-05-22 ENCOUNTER — Ambulatory Visit (INDEPENDENT_AMBULATORY_CARE_PROVIDER_SITE_OTHER): Payer: 59 | Admitting: *Deleted

## 2017-05-22 DIAGNOSIS — I495 Sick sinus syndrome: Secondary | ICD-10-CM | POA: Diagnosis not present

## 2017-05-22 NOTE — Progress Notes (Signed)
Remote pacemaker transmission.   

## 2017-05-23 ENCOUNTER — Encounter: Payer: Self-pay | Admitting: Cardiology

## 2017-06-05 LAB — CUP PACEART REMOTE DEVICE CHECK
Date Time Interrogation Session: 20190103111320
Implantable Lead Implant Date: 20160907
Implantable Lead Location: 753859
Implantable Lead Location: 753860
MDC IDC LEAD IMPLANT DT: 20160907
MDC IDC PG IMPLANT DT: 20160907
Pulse Gen Serial Number: 68620792

## 2017-08-12 ENCOUNTER — Ambulatory Visit: Payer: 59

## 2017-08-14 ENCOUNTER — Ambulatory Visit: Payer: 59 | Admitting: Gastroenterology

## 2017-08-21 ENCOUNTER — Ambulatory Visit (INDEPENDENT_AMBULATORY_CARE_PROVIDER_SITE_OTHER): Payer: 59 | Admitting: *Deleted

## 2017-08-21 DIAGNOSIS — I495 Sick sinus syndrome: Secondary | ICD-10-CM

## 2017-08-21 NOTE — Progress Notes (Signed)
Remote pacemaker transmission.   

## 2017-08-22 ENCOUNTER — Encounter: Payer: Self-pay | Admitting: Cardiology

## 2017-09-16 LAB — CUP PACEART REMOTE DEVICE CHECK
Implantable Lead Implant Date: 20160907
Implantable Lead Implant Date: 20160907
Implantable Lead Location: 753859
Implantable Lead Location: 753860
Implantable Lead Model: 5076
Implantable Lead Model: 5076
Implantable Pulse Generator Implant Date: 20160907
MDC IDC PG SERIAL: 68620792
MDC IDC SESS DTM: 20190416105311

## 2017-10-01 ENCOUNTER — Encounter: Payer: Self-pay | Admitting: Internal Medicine

## 2017-10-02 ENCOUNTER — Encounter: Payer: 59 | Admitting: Internal Medicine

## 2017-10-22 ENCOUNTER — Ambulatory Visit: Payer: 59 | Admitting: Internal Medicine

## 2017-10-22 ENCOUNTER — Encounter: Payer: Self-pay | Admitting: Internal Medicine

## 2017-10-22 VITALS — BP 120/80 | HR 63 | Ht 64.0 in | Wt 151.0 lb

## 2017-10-22 DIAGNOSIS — I495 Sick sinus syndrome: Secondary | ICD-10-CM | POA: Diagnosis not present

## 2017-10-22 DIAGNOSIS — Z950 Presence of cardiac pacemaker: Secondary | ICD-10-CM

## 2017-10-22 DIAGNOSIS — R001 Bradycardia, unspecified: Secondary | ICD-10-CM | POA: Diagnosis not present

## 2017-10-22 NOTE — Progress Notes (Signed)
Agree Plavix and what     Patient Care Team: Celene Squibb, MD as PCP - General (Internal Medicine) Danie Binder, MD (Gastroenterology)   HPI  Robin Wolfe is a 62 y.o. female Seen in follow-up for symptomatic bradycardia  perhaps with a component of autonomic insufficiency. She mild appetite underwent pacing with a Biotronik device 9/16.  Knowing about it being a pilot  No dizziness  Her husband died a year ago with nonsmall cell ca She her self works at hospice  She describes a chest discomfort accompanied by shortness of breath.  It is not predictably associated with exertion is usually associated with stress.  Last summer she tried a PPI trial without benefit.  The strong family history.  Hypercholesterolemia risk factors.  Records and Results Reviewed NONE  Past Medical History:  Diagnosis Date  . Hypercholesteremia   . Insomnia   . 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 S/P placement of cardiac pacemaker, 02/08/15, Biotronik, MIR compatibile  02/09/2015  . Symptomatic sinus bradycardia 02/09/2015  . Thrombocytopenia ([place])   . Ventral hernia     Past Surgical History:  Procedure Laterality Date  . ABDOMINAL HYSTERECTOMY     endometriosis  . CARDIAC CATHETERIZATION N/A 01/16/2015   Procedure: Left Heart Cath and Coronary Angiography;  Surgeon: Burnell Blanks, MD;  Location: [facility];  Service: Cardiovascular;  Laterality: N/A;  . CEREBRAL ANEURYSM REPAIR    . CHOLECYSTECTOMY     ileostomyw/ takedown 8/09 Dr Crisoforo Oxford  . COLON SURGERY    . 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 COLONOSCOPY  09/12/2008    Small rectal pouch, which made retroflexion challenging/ no polyps detected in the rectum/ No diverticula  .  COLONOSCOPY  01/21/2012   Procedure: COLONOSCOPY;  Surgeon: Danie Binder, MD;  [facility];  Service: Endoscopy;  Laterality: N/A;  . EP IMPLANTABLE DEVICE N/A 02/08/2015   Procedure: Pacemaker Implant;  Surgeon: Deboraha Sprang, MD;  Location: [facility];  Service: Cardiovascular;  Laterality: N/A;  . SIGMOIDOSCOPY  12/24/2006   rectal adenocarcinoma/Normal retroflexed view of the rectum  . VENTRAL HERNIA REPAIR      Current Outpatient Medications  Medication Sig Dispense Refill  . escitalopram (LEXAPRO) 5 MG tablet Take 1 tablet by mouth daily.    Marland Kitchen ezetimibe (ZETIA) 10 MG tablet Take 1 tablet (10 mg total) by mouth daily. 30 tablet 6  . ibuprofen (ADVIL,MOTRIN) 200 MG tablet Take 800 mg by mouth every 6 (six) hours as needed.    Marland Kitchen oxybutynin (DITROPAN-XL) 10 MG 24 hr tablet Take 1 tablet by mouth daily.    . psyllium (METAMUCIL) 58.6 % powder Take 1 packet by mouth daily.    . traZODone (DESYREL) 100 MG tablet Take 1 tablet by mouth at bedtime.      No current facility-administered medications for this visit.     Allergies  Allergen Reactions  . Compazine Other (See Comments)    Reaction: seizures  . Hydromorphone Hcl Other (See Comments)    REACTION: hallucinations and behavior issues      Review of Systems negative except from HPI and PMH  Physical Exam BP 120/80   Pulse 63   Ht 5\' 4"  (1.626 m)  Wt 151 lb (68.5 kg)   SpO2 98%   BMI 25.92 kg/m  Well developed and well nourished in no acute distress HENT normal E scleral and icterus clear Neck Supple JVP flat; carotids brisk and full Clear to ausculation Device pocket well healed; without hematoma or erythema.  There is no tethering   Regular rate and rhythm, no murmurs gallops or rub Soft with active bowel sounds No clubbing cyanosis  Edema Alert and oriented, grossly normal motor and sensory function Skin Warm and Dry  ECG   Apacing 62 19/11/43    Assessment and  Plan  Sinus node  dysfunction  Pacemaker-Biotronik  Stress/ depression   Chest Pain with stress      no syncope  Device programming with no change.  Concerned about her chest discomfort.  While it is not predictably associated with exertion it is particularly associate with stress and lack of response to PPI last summer further raises the concern regarding cardiac disease.  Will review with colleagues, presence of her RV lead may be a challenge for CT imaging    We spent more than 50% of our >25 min visit in face to face counseling regarding the above

## 2017-10-22 NOTE — Patient Instructions (Signed)
Medication Instructions:  Your physician recommends that you continue on your current medications as directed. Please refer to the Current Medication list given to you today.  Labwork: None ordered.  Testing/Procedures: We will be in touch regarding your CT test.  Follow-Up: Your physician wants you to follow-up in: One Year with Dr Caryl Comes. You will receive a reminder letter in the mail two months in advance. If you don't receive a letter, please call our office to schedule the follow-up appointment.  Remote monitoring is used to monitor your Pacemaker of ICD from home. This monitoring reduces the number of office visits required to check your device to one time per year. It allows [place] to keep an eye on the functioning of your device to ensure it is working properly. You are scheduled for a device check from home on 11/20/2017. You may send your transmission at any time that day. If you have a wireless device, the transmission will be sent automatically. After your physician reviews your transmission, you will receive a postcard with your next transmission date.    Any Other Special Instructions Will Be Listed Below (If Applicable).     If you need a refill on your cardiac medications before your next appointment, please call your pharmacy.

## 2017-10-23 LAB — CUP PACEART INCLINIC DEVICE CHECK
Brady Statistic RA Percent Paced: 93 %
Brady Statistic RV Percent Paced: 0 %
Implantable Lead Implant Date: 20160907
Implantable Lead Location: 753860
Implantable Lead Model: 5076
Implantable Pulse Generator Implant Date: 20160907
Lead Channel Impedance Value: 370 Ohm
Lead Channel Impedance Value: 448 Ohm
Lead Channel Pacing Threshold Amplitude: 0.5 V
Lead Channel Pacing Threshold Amplitude: 1.6 V
Lead Channel Pacing Threshold Pulse Width: 0.4 ms
Lead Channel Sensing Intrinsic Amplitude: 2.6 mV
Lead Channel Sensing Intrinsic Amplitude: 4.1 mV
Lead Channel Setting Pacing Amplitude: 3 V
MDC IDC LEAD IMPLANT DT: 20160907
MDC IDC LEAD LOCATION: 753859
MDC IDC MSMT LEADCHNL RV PACING THRESHOLD PULSEWIDTH: 0.4 ms
MDC IDC SESS DTM: 20190522193000
MDC IDC SET LEADCHNL RA PACING AMPLITUDE: 2 V
MDC IDC SET LEADCHNL RV PACING PULSEWIDTH: 0.4 ms
Pulse Gen Model: 394929
Pulse Gen Serial Number: 68620792

## 2017-11-19 ENCOUNTER — Encounter: Payer: Self-pay | Admitting: Gastroenterology

## 2017-11-19 ENCOUNTER — Telehealth: Payer: Self-pay | Admitting: *Deleted

## 2017-11-19 ENCOUNTER — Other Ambulatory Visit: Payer: Self-pay | Admitting: *Deleted

## 2017-11-19 ENCOUNTER — Ambulatory Visit: Payer: 59 | Admitting: Gastroenterology

## 2017-11-19 ENCOUNTER — Encounter: Payer: Self-pay | Admitting: *Deleted

## 2017-11-19 DIAGNOSIS — R109 Unspecified abdominal pain: Secondary | ICD-10-CM | POA: Insufficient documentation

## 2017-11-19 DIAGNOSIS — C19 Malignant neoplasm of rectosigmoid junction: Secondary | ICD-10-CM

## 2017-11-19 DIAGNOSIS — R140 Abdominal distension (gaseous): Secondary | ICD-10-CM | POA: Diagnosis not present

## 2017-11-19 MED ORDER — NA SULFATE-K SULFATE-MG SULF 17.5-3.13-1.6 GM/177ML PO SOLN: 1.0000 | ORAL | 0 refills | Status: DC

## 2017-11-19 NOTE — Telephone Encounter (Signed)
PA for TCS was approved via [facility] website. Josem Kaufmann [id number]

## 2017-11-19 NOTE — Patient Instructions (Addendum)
DRINK WATER TO KEEP YOUR URINE LIGHT YELLOW.  TO REDUCE BLOATING/GAS:    1. IF YOU CONSUME DAIRY, ADD LACTASE 3 PILLS WITH MEALS UP TO THREE TIMES A DAY.    2. USE GAS-X WHEN NEEDED FOR BLOATING/GAS.   3. PLEASE CALL IN ONE MONTH IF SYMPTOMS ARE NOT IMPROVED.    COMPLETE COLONOSCOPY IN 3-4 WEEKS.   FOLLOW UP IN THE OFFICE WILL BE SCHEDULED IF NEEDED AFTER ENDOSCOPY.

## 2017-11-19 NOTE — Progress Notes (Addendum)
Subjective:    Patient ID: Nadine Counts, female    DOB: 1955-06-17, 62 y.o.   MRN: [id number]  Celene Squibb, MD   HPI STILL FEELING GASSY. TAKES GAS-X 3-4 TIMES A DAY. GETS REAL BLOATED AND SEEMS LIKE GETS CRAMPING IN HER INTESTINES. HAPPENS AFTER EVERY MEAL OR DRINKING ANYTHING. BEEN GOING ON FOR YEARS. BMs: NO BM FOR ONE WEEK THEN MAY HAVE BMs 1-2 DAYS(#4). BLOATING STARTED BEFORE THE SURGERY. NO REAL MILK. WATER DOESN'T GER HER BLOATED. LOVES CHEESE. DOESN'T DO ICE CREAM THAT OFTEN. HUSBAND PASSED WAY ONE YEAR AGO. QUIT TAKING [place]. OCCASIONAL PANIC ATTACKS. MAY HAVE TROUBLE SWALLOWING IF SHE EATS TOO FAST. HAD A PACEMAKER SINCE 2013. APPETITE: GOOD. GAINED 3 LBS SINCE 2013.   PT DENIES FEVER, CHILLS, HEMATOCHEZIA, HEMATEMESIS, nausea, vomiting, melena, diarrhea, CHEST PAIN, SHORTNESS OF BREATH,  CHANGE IN BOWEL IN HABITS, problems with sedation, OR heartburn or indigestion.  Past Medical History:  Diagnosis Date  . Hypercholesteremia   . Insomnia   . 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 S/P placement of cardiac pacemaker, 02/08/15, Biotronik, MIR compatibile  02/09/2015  . Symptomatic sinus bradycardia 02/09/2015  . Thrombocytopenia ([place])   . Ventral hernia     Past Surgical History:  Procedure Laterality Date  . ABDOMINAL HYSTERECTOMY     endometriosis  . CARDIAC CATHETERIZATION N/A 01/16/2015   Procedure: Left Heart Cath and Coronary Angiography;  Surgeon: Burnell Blanks, MD;  Location: [facility];  Service: Cardiovascular;  Laterality: N/A;  . CEREBRAL ANEURYSM REPAIR    . CHOLECYSTECTOMY     ileostomyw/ takedown 8/09 Dr Crisoforo Oxford  . COLON SURGERY    . 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 COLONOSCOPY   09/12/2008    Small rectal pouch, which made retroflexion challenging/ no polyps detected in the rectum/ No diverticula  . COLONOSCOPY  01/21/2012   Procedure: COLONOSCOPY;  Surgeon: Danie Binder, MD;  [facility];  Service: Endoscopy;  Laterality: N/A;  . EP IMPLANTABLE DEVICE N/A 02/08/2015   Procedure: Pacemaker Implant;  Surgeon: Deboraha Sprang, MD;  Location: [facility];  Service: Cardiovascular;  Laterality: N/A;  . SIGMOIDOSCOPY  12/24/2006   rectal adenocarcinoma/Normal retroflexed view of the rectum  . VENTRAL HERNIA REPAIR     Allergies  Allergen Reactions  . Compazine Other (See Comments)    Reaction: seizures  . Hydromorphone Hcl Other (See Comments)    REACTION: hallucinations and behavior issues   Current Outpatient Medications  Medication Sig    . escitalopram (LEXAPRO) 5 MG tablet Take 1 tablet by mouth daily.    Marland Kitchen ezetimibe (ZETIA) 10 MG tablet Take 1 tablet (10 mg total) by mouth daily.    Marland Kitchen ibuprofen (ADVIL,MOTRIN) 200 MG tablet Take 400 mg by mouth every 6 (six) hours as needed.     Marland Kitchen oxybutynin (DITROPAN-XL) 10 MG 24 hr tablet Take 1 tablet by mouth daily.    . psyllium (METAMUCIL) 58.6 % powder Take 1 packet by mouth daily.    . traZODone (DESYREL) 100 MG tablet Take 1 tablet by mouth at bedtime.       Family History  Problem Relation Age of Onset  . Heart disease Unknown   .  Lung disease Unknown   . Diabetes Unknown   . Colon cancer Neg Hx     Social History   Socioeconomic History  . Marital status: Widowed    Spouse name: Not on file  . Number of children: TWO  . Years of education: Not on file  . Highest education level: Not on file  Occupational History  . Not on file  Social Needs  . Financial resource strain: Not on file  . Food insecurity:    Worry: Not on file    Inability: Not on file  . Transportation needs:    Medical: Not on file    Non-medical: Not on file  Tobacco Use  . Smoking status: Former Smoker     Packs/day: 1.50    Years: 8.00    Pack years: 12.00    Types: Cigarettes    Last attempt to quit: 01/06/2007    Years since quitting: 10.8  . Smokeless tobacco: Never Used  Substance and Sexual Activity  . Alcohol use: Yes    Alcohol/week: 0.0 oz    Comment: rarely  . Drug use: No  . Sexual activity: Yes    Birth control/protection: Surgical  Lifestyle  . Physical activity:    Days per week: Not on file    Minutes per session: Not on file  . Stress: Not on file  Relationships  . Social connections:    Talks on phone: Not on file    Gets together: Not on file    Attends religious service: Not on file    Active member of club or organization: Not on file    Attends meetings of clubs or organizations: Not on file    Relationship status: Not on file  Other Topics Concern  .   Social History Narrative  .    Review of Systems PER HPI OTHERWISE ALL SYSTEMS ARE NEGATIVE.    Objective:   Physical Exam  Constitutional: She is oriented to person, place, and time. She appears well-developed and well-nourished. No distress.  HENT:  Head: Normocephalic and atraumatic.  Mouth/Throat: Oropharynx is clear and moist. No oropharyngeal exudate.  Eyes: Pupils are equal, round, and reactive to light. No scleral icterus.  Neck: Normal range of motion. Neck supple.  Cardiovascular: Normal rate, regular rhythm and normal heart sounds.  Pulmonary/Chest: Effort normal and breath sounds normal. No respiratory distress.  Abdominal: Soft. Bowel sounds are normal. She exhibits no distension. There is no tenderness.  Musculoskeletal: She exhibits no edema.  Lymphadenopathy:    She has no cervical adenopathy.  Neurological: She is alert and oriented to person, place, and time.  NO  NEW FOCAL DEFICITS  Psychiatric:  FLAT AFFECT, NL MOOD  Vitals reviewed.     Assessment & Plan:

## 2017-11-19 NOTE — Progress Notes (Signed)
CC'D TO PCP °

## 2017-11-19 NOTE — Assessment & Plan Note (Addendum)
NO BRBPR OR MELENA.  COMPLETE COLONOSCOPY IN 3-4 WEEKS. DISCUSSED PROCEDURE, BENEFITS, & RISKS: < 1% chance of medication reaction, bleeding, perforation, or rupture of spleen/liver. YOUR SISTERS, BROTHERS, CHILDREN, AND PARENTS NEED TO HAVE A COLONOSCOPY STARTING AT THE AGE OF 40 and then every 5 years FOLLOW UP IN THE OFFICE WILL BE SCHEDULED IF NEEDED AFTER ENDOSCOPY.

## 2017-11-19 NOTE — Assessment & Plan Note (Signed)
SYMPTOMS NOT IDEALLY CONTROLLED.  DRINK WATER TO KEEP YOUR URINE LIGHT YELLOW. IF YOU CONSUME DAIRY, ADD LACTASE 3 PILLS WITH MEALS UP TO THREE TIMES A DAY. USE GAS-X WHEN NEEDED FOR BLOATING/GAS. FOLLOW UP IN THE OFFICE WILL BE SCHEDULED IF NEEDED AFTER ENDOSCOPY.

## 2017-11-20 ENCOUNTER — Ambulatory Visit (INDEPENDENT_AMBULATORY_CARE_PROVIDER_SITE_OTHER): Payer: 59 | Admitting: *Deleted

## 2017-11-20 DIAGNOSIS — R001 Bradycardia, unspecified: Secondary | ICD-10-CM

## 2017-11-20 NOTE — Progress Notes (Signed)
Remote pacemaker transmission.   

## 2017-12-12 LAB — CUP PACEART REMOTE DEVICE CHECK
Date Time Interrogation Session: 20190712083648
Implantable Lead Implant Date: 20160907
Implantable Lead Location: 753859
MDC IDC LEAD IMPLANT DT: 20160907
MDC IDC LEAD LOCATION: 753860
MDC IDC PG IMPLANT DT: 20160907
Pulse Gen Model: 394929
Pulse Gen Serial Number: 68620792

## 2017-12-25 ENCOUNTER — Encounter (HOSPITAL_COMMUNITY): Payer: Self-pay | Admitting: Emergency Medicine

## 2017-12-25 ENCOUNTER — Encounter (HOSPITAL_COMMUNITY)
Admission: RE | Disposition: A | Discharge status: Home / Self Care | Payer: Self-pay | Source: Ambulatory Visit | Attending: Gastroenterology

## 2017-12-25 ENCOUNTER — Other Ambulatory Visit: Payer: Self-pay

## 2017-12-25 ENCOUNTER — Ambulatory Visit (HOSPITAL_COMMUNITY)
Admission: RE | Admit: 2017-12-25 | Discharge: 2017-12-25 | Disposition: A | Discharge status: Home / Self Care | Payer: 59 | Source: Ambulatory Visit | Attending: Gastroenterology | Admitting: Gastroenterology

## 2017-12-25 DIAGNOSIS — K644 Residual hemorrhoidal skin tags: Secondary | ICD-10-CM

## 2017-12-25 DIAGNOSIS — Z85048 Personal history of other malignant neoplasm of rectum, rectosigmoid junction, and anus: Secondary | ICD-10-CM

## 2017-12-25 DIAGNOSIS — Z950 Presence of cardiac pacemaker: Secondary | ICD-10-CM | POA: Diagnosis not present

## 2017-12-25 DIAGNOSIS — Z87891 Personal history of nicotine dependence: Secondary | ICD-10-CM | POA: Insufficient documentation

## 2017-12-25 DIAGNOSIS — E7800 Pure hypercholesterolemia, unspecified: Secondary | ICD-10-CM | POA: Diagnosis not present

## 2017-12-25 DIAGNOSIS — Z79899 Other long term (current) drug therapy: Secondary | ICD-10-CM | POA: Insufficient documentation

## 2017-12-25 DIAGNOSIS — C19 Malignant neoplasm of rectosigmoid junction: Secondary | ICD-10-CM

## 2017-12-25 DIAGNOSIS — Z800 Family history of malignant neoplasm of digestive organs: Secondary | ICD-10-CM | POA: Diagnosis not present

## 2017-12-25 DIAGNOSIS — G4700 Insomnia, unspecified: Secondary | ICD-10-CM | POA: Insufficient documentation

## 2017-12-25 HISTORY — PX: COLONOSCOPY: SHX5424

## 2017-12-25 SURGERY — COLONOSCOPY
Anesthesia: Moderate Sedation

## 2017-12-25 MED ORDER — MIDAZOLAM HCL 5 MG/5ML IJ SOLN
INTRAMUSCULAR | Status: AC
  Filled 2017-12-25: qty 10

## 2017-12-25 MED ORDER — MEPERIDINE HCL 100 MG/ML IJ SOLN
INTRAMUSCULAR | Status: DC | PRN
  Administered 2017-12-25: 50 mg via INTRAVENOUS
  Administered 2017-12-25 (×2): 25 mg via INTRAVENOUS

## 2017-12-25 MED ORDER — LIDOCAINE HCL URETHRAL/MUCOSAL 2 % EX GEL
CUTANEOUS | Status: AC
  Filled 2017-12-25: qty 30

## 2017-12-25 MED ORDER — SODIUM CHLORIDE 0.9% FLUSH
INTRAVENOUS | Status: AC
  Administered 2017-12-25: 10 mL
  Filled 2017-12-25: qty 10

## 2017-12-25 MED ORDER — PROMETHAZINE HCL 25 MG/ML IJ SOLN
INTRAMUSCULAR | Status: AC
  Administered 2017-12-25: 12.5 mg
  Filled 2017-12-25: qty 1

## 2017-12-25 MED ORDER — PROMETHAZINE HCL 25 MG/ML IJ SOLN
12.5000 mg | Freq: Once | INTRAMUSCULAR | Status: AC
  Administered 2017-12-25: 12.5 mg via INTRAVENOUS

## 2017-12-25 MED ORDER — LIDOCAINE HCL URETHRAL/MUCOSAL 2 % EX GEL
CUTANEOUS | Status: DC | PRN
  Administered 2017-12-25: 1

## 2017-12-25 MED ORDER — SODIUM CHLORIDE 0.9 % IV SOLN
INTRAVENOUS | Status: DC
  Administered 2017-12-25: 13:00:00 via INTRAVENOUS

## 2017-12-25 MED ORDER — MIDAZOLAM HCL 5 MG/5ML IJ SOLN
INTRAMUSCULAR | Status: DC | PRN
  Administered 2017-12-25 (×2): 2 mg via INTRAVENOUS
  Administered 2017-12-25: 1 mg via INTRAVENOUS

## 2017-12-25 MED ORDER — MEPERIDINE HCL 100 MG/ML IJ SOLN
INTRAMUSCULAR | Status: DC
Start: 2017-12-25 — End: 2017-12-25
  Filled 2017-12-25: qty 2

## 2017-12-25 MED ORDER — STERILE WATER FOR IRRIGATION IR SOLN
Status: DC | PRN
  Administered 2017-12-25: 2.5 mL

## 2017-12-25 NOTE — H&P (Signed)
Primary Care Physician:  Celene Squibb, MD Primary Gastroenterologist:  Dr. Oneida Alar  Pre-Procedure History & Physical: HPI:  Robin Wolfe is a 62 y.o. female here for PERSONAL HISTORY OF rectal cancer.   Past Medical History:  Diagnosis Date  . Hypercholesteremia   . Insomnia   . 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 S/P placement of cardiac pacemaker, 02/08/15, Biotronik, MIR compatibile  02/09/2015  . Symptomatic sinus bradycardia 02/09/2015  . Thrombocytopenia ([place])   . Ventral hernia     Past Surgical History:  Procedure Laterality Date  . ABDOMINAL HYSTERECTOMY     endometriosis  . CARDIAC CATHETERIZATION N/A 01/16/2015   Procedure: Left Heart Cath and Coronary Angiography;  Surgeon: Burnell Blanks, MD;  Location: [place] [facility];  Service: Cardiovascular;  Laterality: N/A;  . CEREBRAL ANEURYSM REPAIR    . CHOLECYSTECTOMY     ileostomyw/ takedown 8/09 Dr Crisoforo Oxford  . COLON SURGERY    . 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 COLONOSCOPY  09/12/2008    Small rectal pouch, which made retroflexion challenging/ no polyps detected in the rectum/ No diverticula  . COLONOSCOPY  01/21/2012   Procedure: COLONOSCOPY;  Surgeon: Danie Binder, MD;  [facility];  Service: Endoscopy;  Laterality: N/A;  . EP IMPLANTABLE DEVICE N/A 02/08/2015   Procedure: Pacemaker Implant;  Surgeon: Deboraha Sprang, MD;  Location: [place] [facility];  Service: Cardiovascular;  Laterality: N/A;  . SIGMOIDOSCOPY  12/24/2006   rectal adenocarcinoma/Normal retroflexed view of the rectum  . VENTRAL HERNIA REPAIR      Prior to Admission medications   Medication Sig Start Date End Date Taking? Authorizing Provider  Carboxymethylcellulose Sodium  (THERATEARS) 0.25 % SOLN Place 1 drop into both eyes 2 (two) times daily.   Yes [provider]  cetirizine (ZYRTEC) 10 MG tablet Take 10 mg by mouth daily.   Yes [provider]  escitalopram (LEXAPRO) 10 MG tablet Take 10 mg by mouth at bedtime. 11/14/17  Yes [provider]  ezetimibe (ZETIA) 10 MG tablet Take 1 tablet (10 mg total) by mouth daily. Patient taking differently: Take 10 mg by mouth at bedtime.  06/26/15  Yes Skeet Latch, MD  ibuprofen (ADVIL,MOTRIN) 200 MG tablet Take 400 mg by mouth every 8 (eight) hours as needed (for pain.).    Yes [provider]  Na Sulfate-K Sulfate-Mg Sulf 17.5-3.13-1.6 GM/177ML SOLN Take 1 kit by mouth as directed. 11/19/17  Yes Ifrah Vest L, MD  oxybutynin (DITROPAN-XL) 10 MG 24 hr tablet Take 10 mg by mouth daily.  02/04/17  Yes [provider]  psyllium (METAMUCIL) 58.6 % powder Take 1 packet by mouth daily.   Yes [provider]  traZODone (DESYREL) 100 MG tablet Take 100 mg by mouth at bedtime.  01/28/17  Yes [provider]    Allergies as of 11/19/2017 - Review Complete 11/19/2017  Allergen Reaction Noted  . Compazine Other (See Comments) 12/31/2010  . Hydromorphone hcl Other (See Comments)     Family History  Problem Relation Age of Onset  . Heart disease Unknown   . Lung disease Unknown   . Diabetes Unknown   . Colon cancer Neg Hx     Social History  Socioeconomic History  . Marital status: Widowed    Spouse name: Not on file  . Number of children: Not on file  . Years of education: Not on file  . Highest education level: Not on file  Occupational History  . Not on file  Social Needs  . Financial resource strain: Not on file  . Food insecurity:    Worry: Not on file    Inability: Not on file  . Transportation needs:    Medical: Not on file    Non-medical: Not on file  Tobacco Use  . Smoking status: Former Smoker    Packs/day: 1.50    Years: 8.00    Pack  years: 12.00    Types: Cigarettes    Last attempt to quit: 01/06/2007    Years since quitting: 10.9  . Smokeless tobacco: Never Used  Substance and Sexual Activity  . Alcohol use: Yes    Alcohol/week: 0.0 oz    Comment: rarely  . Drug use: No  . Sexual activity: Yes    Birth control/protection: Surgical  Lifestyle  . Physical activity:    Days per week: Not on file    Minutes per session: Not on file  . Stress: Not on file  Relationships  . Social connections:    Talks on phone: Not on file    Gets together: Not on file    Attends religious service: Not on file    Active member of club or organization: Not on file    Attends meetings of clubs or organizations: Not on file    Relationship status: Not on file  . Intimate partner violence:    Fear of current or ex partner: Not on file    Emotionally abused: Not on file    Physically abused: Not on file    Forced sexual activity: Not on file  Other Topics Concern  . Not on file  Social History Narrative  . Not on file    Review of Systems: See HPI, otherwise negative ROS   Physical Exam: BP (!) 134/94   Pulse 76   Temp (!) 97.5 F (36.4 C) (Oral)   Resp 18   Ht 5' 4"  (1.626 m)   Wt 153 lb (69.4 kg)   SpO2 97%   BMI 26.26 kg/m  General:   Alert,  pleasant and cooperative in NAD Head:  Normocephalic and atraumatic. Neck:  Supple; Lungs:  Clear throughout to auscultation.    Heart:  Regular rate and rhythm. Abdomen:  Soft, nontender and nondistended. Normal bowel sounds, without guarding, and without rebound.   Neurologic:  Alert and  oriented x4;  grossly normal neurologically.  Impression/Plan:     PERSONAL HISTORY OF rectal cancer  PLAN: 1. TCS TODAY. DISCUSSED PROCEDURE, BENEFITS, & RISKS: < 1% chance of medication reaction, bleeding, perforation, or rupture of spleen/liver.

## 2017-12-25 NOTE — Op Note (Signed)
[facility] Patient Name: Robin Wolfe Procedure Date: 12/25/2017 1:31 PM MRN: [id number] Date of Birth: 05/11/1956 Attending MD: Barney Drain MD, MD CSN: [id number] Age: 62 Admit Type: Outpatient Procedure:                Colonoscopy, SURVEILLANCE Indications:              Personal history of malignant rectal neoplasm Providers:                Barney Drain MD, MD, Gwynneth Albright RN, RN,                            Randa Spike, Technician Referring MD:             Edwinna Areola. [place] Crane MD Medicines:                Meperidine 100 mg IV, Midazolam 5 mg IV,                            Promethazine 12.5 mg IV, LIDOCAINE JELLY APPLIED TO                            RECTUM Complications:            No immediate complications. Estimated Blood Loss:     Estimated blood loss: none. Procedure:                Pre-Anesthesia Assessment:                           - Prior to the procedure, a History and Physical                            was performed, and patient medications and                            allergies were reviewed. The patient's tolerance of                            previous anesthesia was also reviewed. The risks                            and benefits of the procedure and the sedation                            options and risks were discussed with the patient.                            All questions were answered, and informed consent                            was obtained. Prior Anticoagulants: The patient has                            taken no previous anticoagulant or antiplatelet  agents. ASA Grade Assessment: II - A patient with                            mild systemic disease. After reviewing the risks                            and benefits, the patient was deemed in                            satisfactory condition to undergo the procedure.                            After obtaining informed consent, the colonoscope      was passed under direct vision. Throughout the                            procedure, the patient's blood pressure, pulse, and                            oxygen saturations were monitored continuously. The                            CF-HQ190L ([id number]) scope was introduced through                            the anus and advanced to the the cecum, identified                            by appendiceal orifice and ileocecal valve. The                            colonoscopy was somewhat difficult due to                            post-surgical anatomy and a tortuous colon.                            Successful completion of the procedure was aided by                            straightening and shortening the scope to obtain                            bowel loop reduction and COLOWRAP. The patient                            tolerated the procedure fairly well. The quality of                            the bowel preparation was excellent. The ileocecal                            valve, appendiceal orifice, and rectum were  photographed. Scope In: 2:03:17 PM Scope Out: 2:26:53 PM Scope Withdrawal Time: 0 hours 20 minutes 22 seconds  Total Procedure Duration: 0 hours 23 minutes 36 seconds  Findings:      The anastomosis appeared normal.      The entire examined colon appeared normal.      External hemorrhoids were found. The hemorrhoids were moderate. ABLE TO       RETROFLEX WITHOUT DIFFICULTY WITH ADULT GASTROSCOPE WHICH WAS USED DUE       TO SMALL SIZE OF THE RECTAL REMNANT. Impression:               - The colonic anastomosis is normal.                           - The entire examined colon is normal.                           - External hemorrhoids. Moderate Sedation:      Moderate (conscious) sedation was administered by the endoscopy nurse       and supervised by the endoscopist. The following parameters were       monitored: oxygen saturation, heart rate, blood  pressure, and response       to care. Total physician intraservice time was 35 minutes. Recommendation:           - Patient has a contact number available for                            emergencies. The signs and symptoms of potential                            delayed complications were discussed with the                            patient. Return to normal activities tomorrow.                            Written discharge instructions were provided to the                            patient.                           - High fiber diet.                           - Continue present medications.                           - Repeat colonoscopy in 5 years for surveillance                            WITH PEDIATRIC COLONOSCOPE. Procedure Code(s):        --- Professional ---                           [phone], Colonoscopy, flexible; diagnostic, including  collection of specimen(s) by brushing or washing,                            when performed (separate procedure)                           G0500, Moderate sedation services provided by the                            same physician or other qualified health care                            professional performing a gastrointestinal                            endoscopic service that sedation supports,                            requiring the presence of an independent trained                            observer to assist in the monitoring of the                            patient's level of consciousness and physiological                            status; initial 15 minutes of intra-service time;                            patient age 66 years or older (additional time may                            be reported with [phone], as appropriate)                           [phone], Moderate sedation services provided by the                            same physician or other qualified health care                            professional  performing the diagnostic or                            therapeutic service that the sedation supports,                            requiring the presence of an independent trained                            observer to assist in the monitoring of the  patient's level of consciousness and physiological                            status; each additional 15 minutes intraservice                            time (List separately in addition to code for                            primary service) Diagnosis Code(s):        --- Professional ---                           K64.4, Residual hemorrhoidal skin tags                           Z85.048, Personal history of other malignant                            neoplasm of rectum, rectosigmoid junction, and anus CPT copyright 2017 [employer]. All rights reserved. The codes documented in this report are preliminary and upon coder review may  be revised to meet current compliance requirements. Barney Drain, MD Barney Drain MD, MD 12/25/2017 2:40:59 PM This report has been signed electronically. Number of Addenda: 0

## 2017-12-25 NOTE — Discharge Instructions (Signed)
You have EXTERNAL hemorrhoids. YOU DID NOT HAVE ANY POLYPS.   DRINK WATER TO KEEP YOUR URINE LIGHT YELLOW.  FOLLOW A HIGH FIBER DIET. AVOID ITEMS THAT CAUSE BLOATING. SEE INFO BELOW.  USE VISCOUS LIDOCAINE OR PREPARATION H 2 TO 4 TIMES A DAY TO RELIEVE RECTAL PAIN.  NEXT COLONOSCOPY IN 5 YEARS. YOUR SISTERS, BROTHERS, CHILDREN, AND PARENTS NEED TO HAVE A COLONOSCOPY STARTING AT THE AGE OF 40 and then every 5 years   Colonoscopy Care After Read the instructions outlined below and refer to this sheet in the next week. These discharge instructions provide you with general information on caring for yourself after you leave the hospital. While your treatment has been planned according to the most current medical practices available, unavoidable complications occasionally occur. If you have any problems or questions after discharge, call DR. Cohen Boettner, [phone].  ACTIVITY  You may resume your regular activity, but move at a slower pace for the next 24 hours.   Take frequent rest periods for the next 24 hours.   Walking will help get rid of the air and reduce the bloated feeling in your belly (abdomen).   No driving for 24 hours (because of the medicine (anesthesia) used during the test).   You may shower.   Do not sign any important legal documents or operate any machinery for 24 hours (because of the anesthesia used during the test).    NUTRITION  Drink plenty of fluids.   You may resume your normal diet as instructed by your doctor.   Begin with a light meal and progress to your normal diet. Heavy or fried foods are harder to digest and may make you feel sick to your stomach (nauseated).   Avoid alcoholic beverages for 24 hours or as instructed.    MEDICATIONS  You may resume your normal medications.   WHAT YOU CAN EXPECT TODAY  Some feelings of bloating in the abdomen.   Passage of more gas than usual.   Spotting of blood in your stool or on the toilet paper  .    IF YOU HAD POLYPS REMOVED DURING THE COLONOSCOPY:  Eat a soft diet IF YOU HAVE NAUSEA, BLOATING, ABDOMINAL PAIN, OR VOMITING.    FINDING OUT THE RESULTS OF YOUR TEST Not all test results are available during your visit. DR. Oneida Alar WILL CALL YOU WITHIN 7 DAYS OF YOUR PROCEDUE WITH YOUR RESULTS. Do not assume everything is normal if you have not heard from DR. Emmit Oriley IN ONE WEEK, CALL HER OFFICE AT [phone].  SEEK IMMEDIATE MEDICAL ATTENTION AND CALL THE OFFICE: [phone] IF:  You have more than a spotting of blood in your stool.   Your belly is swollen (abdominal distention).   You are nauseated or vomiting.   You have a temperature over 101F.   You have abdominal pain or discomfort that is severe or gets worse throughout the day.  High-Fiber Diet A high-fiber diet changes your normal diet to include more whole grains, legumes, fruits, and vegetables. Changes in the diet involve replacing refined carbohydrates with unrefined foods. The calorie level of the diet is essentially unchanged. The Dietary Reference Intake (recommended amount) for adult males is 38 grams per day. For adult females, it is 25 grams per day. Pregnant and lactating women should consume 28 grams of fiber per day. Fiber is the intact part of a plant that is not broken down during digestion. Functional fiber is fiber that has been isolated from the plant to provide a  beneficial effect in the body. PURPOSE  Increase stool bulk.   Ease and regulate bowel movements.   Lower cholesterol.   REDUCE RISK OF COLON CANCER  INDICATIONS THAT YOU NEED MORE FIBER  Constipation and hemorrhoids.   Uncomplicated diverticulosis (intestine condition) and irritable bowel syndrome.   Weight management.   As a protective measure against hardening of the arteries (atherosclerosis), diabetes, and cancer.    INCLUDE A VARIETY OF FIBER SOURCES  Replace refined and processed grains with whole grains, canned fruits with  fresh fruits, and incorporate other fiber sources. White rice, white breads, and most bakery goods contain little or no fiber.   Brown whole-grain rice, buckwheat oats, and many fruits and vegetables are all good sources of fiber. These include: broccoli, Brussels sprouts, cabbage, cauliflower, beets, sweet potatoes, white potatoes (skin on), carrots, tomatoes, eggplant, squash, berries, fresh fruits, and dried fruits.   Cereals appear to be the richest source of fiber. Cereal fiber is found in whole grains and bran. Bran is the fiber-rich outer coat of cereal grain, which is largely removed in refining. In whole-grain cereals, the bran remains. In breakfast cereals, the largest amount of fiber is found in those with "bran" in their names. The fiber content is sometimes indicated on the label.   You may need to include additional fruits and vegetables each day.     GUIDELINES FOR INCREASING FIBER IN THE DIET  Start adding fiber to the diet slowly. A gradual increase of about 5 more grams (2 slices of whole-wheat bread, 2 servings of most fruits or vegetables, or 1 bowl of high-fiber cereal) per day is best. Too rapid an increase in fiber may result in constipation, flatulence, and bloating.   Drink enough water and fluids to keep your urine clear or pale yellow. Water, juice, or caffeine-free drinks are recommended. Not drinking enough fluid may cause constipation.   Eat a variety of high-fiber foods rather than one type of fiber.   Try to increase your intake of fiber through using high-fiber foods rather than fiber pills or supplements that contain small amounts of fiber.   The goal is to change the types of food eaten. Do not supplement your present diet with high-fiber foods, but replace foods in your present diet.

## 2017-12-25 NOTE — Progress Notes (Signed)
Robin Wolfe. [name] can NOT return to work until 24 hours after sedation.  She may return to work on Monday, December 29, 2017. Questions, contact Vonda Antigua at [place] [phone].

## 2017-12-25 NOTE — Progress Notes (Signed)
Dr.Fields assessed patient's neck where the patient had scratched due to oxygen tubing touching neck. Patient told to use hydrocortisone cream on neck. Tropical reaction per Dr. Oneida Alar. VSS.

## 2017-12-30 ENCOUNTER — Telehealth: Payer: Self-pay

## 2017-12-30 ENCOUNTER — Encounter (HOSPITAL_COMMUNITY): Payer: Self-pay | Admitting: Gastroenterology

## 2017-12-30 NOTE — Telephone Encounter (Signed)
Pt called and said she had colonoscopy on 12/25/2017 last week. She has not had a BM since. She has had the urge to have one twice this AM, and both times passed a blood clot about the size of the end of her thumb. She said the blood is not bright red, but dark ( not black). There was also some mucus in it. She has not had any abdominal pain and no other concerns. Return call to her work at [phone].  Dr. Oneida Alar, please advise!

## 2017-12-30 NOTE — Telephone Encounter (Signed)
PT is aware.

## 2017-12-30 NOTE — Telephone Encounter (Signed)
PLEASE CALL PT. I REVIEWED HER TCS REPORT. During her colonoscopy she DID HAVE SOME some scope trauma where her recTum meets her colon. She may see small blood clots. This will get better over the next 7 days. IF SHE SEE PERSIST RECTAL BLEEDING THEN SHE SHOULD GO TO THE ED.

## 2018-02-19 ENCOUNTER — Ambulatory Visit (INDEPENDENT_AMBULATORY_CARE_PROVIDER_SITE_OTHER): Payer: 59 | Admitting: *Deleted

## 2018-02-19 DIAGNOSIS — R001 Bradycardia, unspecified: Secondary | ICD-10-CM

## 2018-02-19 DIAGNOSIS — I495 Sick sinus syndrome: Secondary | ICD-10-CM | POA: Diagnosis not present

## 2018-02-19 NOTE — Progress Notes (Signed)
Remote pacemaker transmission.   

## 2018-03-11 LAB — CUP PACEART REMOTE DEVICE CHECK
Implantable Lead Implant Date: 20160907
Implantable Lead Implant Date: 20160907
Implantable Lead Location: 753859
Implantable Lead Model: 5076
Implantable Pulse Generator Implant Date: 20160907
MDC IDC LEAD LOCATION: 753860
MDC IDC PG SERIAL: 68620792
MDC IDC SESS DTM: 20191009133819
Pulse Gen Model: 394929

## 2018-05-21 ENCOUNTER — Ambulatory Visit (INDEPENDENT_AMBULATORY_CARE_PROVIDER_SITE_OTHER): Payer: 59

## 2018-05-21 DIAGNOSIS — I495 Sick sinus syndrome: Secondary | ICD-10-CM | POA: Diagnosis not present

## 2018-05-21 NOTE — Progress Notes (Signed)
Remote pacemaker transmission.   

## 2018-05-22 DIAGNOSIS — W19XXXA Unspecified fall, initial encounter: Secondary | ICD-10-CM | POA: Diagnosis not present

## 2018-05-22 DIAGNOSIS — S4992XA Unspecified injury of left shoulder and upper arm, initial encounter: Secondary | ICD-10-CM | POA: Diagnosis not present

## 2018-05-25 DIAGNOSIS — C189 Malignant neoplasm of colon, unspecified: Secondary | ICD-10-CM | POA: Diagnosis not present

## 2018-05-25 DIAGNOSIS — D509 Iron deficiency anemia, unspecified: Secondary | ICD-10-CM | POA: Diagnosis not present

## 2018-05-25 DIAGNOSIS — E782 Mixed hyperlipidemia: Secondary | ICD-10-CM | POA: Diagnosis not present

## 2018-05-25 DIAGNOSIS — D696 Thrombocytopenia, unspecified: Secondary | ICD-10-CM | POA: Diagnosis not present

## 2018-06-01 DIAGNOSIS — N3281 Overactive bladder: Secondary | ICD-10-CM | POA: Diagnosis not present

## 2018-06-01 DIAGNOSIS — E782 Mixed hyperlipidemia: Secondary | ICD-10-CM | POA: Diagnosis not present

## 2018-06-01 DIAGNOSIS — D696 Thrombocytopenia, unspecified: Secondary | ICD-10-CM | POA: Diagnosis not present

## 2018-06-01 DIAGNOSIS — C189 Malignant neoplasm of colon, unspecified: Secondary | ICD-10-CM | POA: Diagnosis not present

## 2018-06-01 DIAGNOSIS — G4700 Insomnia, unspecified: Secondary | ICD-10-CM | POA: Diagnosis not present

## 2018-06-21 LAB — CUP PACEART REMOTE DEVICE CHECK
Date Time Interrogation Session: 20200119063222
Implantable Lead Implant Date: 20160907
Implantable Lead Location: 753860
Implantable Lead Model: 5076
Implantable Lead Model: 5076
MDC IDC LEAD IMPLANT DT: 20160907
MDC IDC LEAD LOCATION: 753859
MDC IDC PG IMPLANT DT: 20160907
MDC IDC PG SERIAL: 68620792

## 2018-08-20 ENCOUNTER — Encounter: Payer: 59 | Admitting: *Deleted

## 2018-08-20 ENCOUNTER — Other Ambulatory Visit: Payer: Self-pay

## 2019-01-05 ENCOUNTER — Emergency Department (HOSPITAL_COMMUNITY): Payer: 59

## 2019-01-05 ENCOUNTER — Other Ambulatory Visit: Payer: Self-pay

## 2019-01-05 ENCOUNTER — Observation Stay (HOSPITAL_COMMUNITY): Payer: 59

## 2019-01-05 ENCOUNTER — Encounter (HOSPITAL_COMMUNITY): Payer: Self-pay | Admitting: *Deleted

## 2019-01-05 ENCOUNTER — Observation Stay (HOSPITAL_COMMUNITY)
Admission: EM | Admit: 2019-01-05 | Discharge: 2019-01-06 | Disposition: A | Discharge status: Home / Self Care | Payer: 59 | Attending: Family Medicine | Admitting: Family Medicine

## 2019-01-05 DIAGNOSIS — Z9049 Acquired absence of other specified parts of digestive tract: Secondary | ICD-10-CM | POA: Diagnosis not present

## 2019-01-05 DIAGNOSIS — R7989 Other specified abnormal findings of blood chemistry: Secondary | ICD-10-CM | POA: Insufficient documentation

## 2019-01-05 DIAGNOSIS — I700 Atherosclerosis of aorta: Secondary | ICD-10-CM | POA: Insufficient documentation

## 2019-01-05 DIAGNOSIS — Z9221 Personal history of antineoplastic chemotherapy: Secondary | ICD-10-CM | POA: Diagnosis not present

## 2019-01-05 DIAGNOSIS — Z79899 Other long term (current) drug therapy: Secondary | ICD-10-CM | POA: Insufficient documentation

## 2019-01-05 DIAGNOSIS — I2510 Atherosclerotic heart disease of native coronary artery without angina pectoris: Secondary | ICD-10-CM | POA: Diagnosis not present

## 2019-01-05 DIAGNOSIS — I200 Unstable angina: Principal | ICD-10-CM | POA: Insufficient documentation

## 2019-01-05 DIAGNOSIS — R200 Anesthesia of skin: Secondary | ICD-10-CM | POA: Diagnosis not present

## 2019-01-05 DIAGNOSIS — Z87891 Personal history of nicotine dependence: Secondary | ICD-10-CM | POA: Insufficient documentation

## 2019-01-05 DIAGNOSIS — R0789 Other chest pain: Secondary | ICD-10-CM

## 2019-01-05 DIAGNOSIS — F419 Anxiety disorder, unspecified: Secondary | ICD-10-CM | POA: Diagnosis not present

## 2019-01-05 DIAGNOSIS — R079 Chest pain, unspecified: Secondary | ICD-10-CM | POA: Diagnosis present

## 2019-01-05 DIAGNOSIS — D696 Thrombocytopenia, unspecified: Secondary | ICD-10-CM | POA: Diagnosis not present

## 2019-01-05 DIAGNOSIS — G9389 Other specified disorders of brain: Secondary | ICD-10-CM | POA: Diagnosis not present

## 2019-01-05 DIAGNOSIS — Z8249 Family history of ischemic heart disease and other diseases of the circulatory system: Secondary | ICD-10-CM | POA: Insufficient documentation

## 2019-01-05 DIAGNOSIS — G4700 Insomnia, unspecified: Secondary | ICD-10-CM | POA: Diagnosis not present

## 2019-01-05 DIAGNOSIS — Z950 Presence of cardiac pacemaker: Secondary | ICD-10-CM | POA: Diagnosis not present

## 2019-01-05 DIAGNOSIS — E7800 Pure hypercholesterolemia, unspecified: Secondary | ICD-10-CM | POA: Insufficient documentation

## 2019-01-05 DIAGNOSIS — Z20828 Contact with and (suspected) exposure to other viral communicable diseases: Secondary | ICD-10-CM | POA: Diagnosis not present

## 2019-01-05 DIAGNOSIS — G459 Transient cerebral ischemic attack, unspecified: Secondary | ICD-10-CM

## 2019-01-05 DIAGNOSIS — Z85048 Personal history of other malignant neoplasm of rectum, rectosigmoid junction, and anus: Secondary | ICD-10-CM | POA: Diagnosis not present

## 2019-01-05 HISTORY — DX: Presence of cardiac pacemaker: Z95.0

## 2019-01-05 LAB — RAPID URINE DRUG SCREEN, HOSP PERFORMED
Amphetamines: NOT DETECTED
Barbiturates: NOT DETECTED
Benzodiazepines: NOT DETECTED
Cocaine: NOT DETECTED
Opiates: NOT DETECTED
Tetrahydrocannabinol: NOT DETECTED

## 2019-01-05 LAB — DIFFERENTIAL
Abs Immature Granulocytes: 0.01 10*3/uL (ref 0.00–0.07)
Basophils Absolute: 0 10*3/uL (ref 0.0–0.1)
Basophils Relative: 1 %
Eosinophils Absolute: 0.1 10*3/uL (ref 0.0–0.5)
Eosinophils Relative: 2 %
Immature Granulocytes: 0 %
Lymphocytes Relative: 26 %
Lymphs Abs: 1 10*3/uL (ref 0.7–4.0)
Monocytes Absolute: 0.3 10*3/uL (ref 0.1–1.0)
Monocytes Relative: 7 %
Neutro Abs: 2.4 10*3/uL (ref 1.7–7.7)
Neutrophils Relative %: 64 %

## 2019-01-05 LAB — COMPREHENSIVE METABOLIC PANEL
ALT: 19 U/L (ref 0–44)
AST: 20 U/L (ref 15–41)
Albumin: 3.9 g/dL (ref 3.5–5.0)
Alkaline Phosphatase: 63 U/L (ref 38–126)
Anion gap: 10 (ref 5–15)
BUN: 10 mg/dL (ref 8–23)
CO2: 27 mmol/L (ref 22–32)
Calcium: 9.3 mg/dL (ref 8.9–10.3)
Chloride: 102 mmol/L (ref 98–111)
Creatinine, Ser: 0.9 mg/dL (ref 0.44–1.00)
GFR calc Af Amer: >60 mL/min (ref >60)
GFR calc non Af Amer: >60 mL/min (ref >60)
Glucose, Bld: 105 mg/dL — ABNORMAL HIGH (ref 70–99)
Potassium: 3.4 mmol/L — ABNORMAL LOW (ref 3.5–5.1)
Sodium: 139 mmol/L (ref 135–145)
Total Bilirubin: 0.6 mg/dL (ref 0.3–1.2)
Total Protein: 6.9 g/dL (ref 6.5–8.1)

## 2019-01-05 LAB — PROTIME-INR
INR: 1 (ref 0.8–1.2)
Prothrombin Time: 13.4 seconds (ref 11.4–15.2)

## 2019-01-05 LAB — APTT: aPTT: 38 seconds — ABNORMAL HIGH (ref 24–36)

## 2019-01-05 LAB — URINALYSIS, ROUTINE W REFLEX MICROSCOPIC
Bacteria, UA: NONE SEEN
Bilirubin Urine: NEGATIVE
Glucose, UA: NEGATIVE mg/dL
Hgb urine dipstick: NEGATIVE
Ketones, ur: NEGATIVE mg/dL
Nitrite: NEGATIVE
Protein, ur: NEGATIVE mg/dL
Specific Gravity, Urine: 1.009 (ref 1.005–1.030)
pH: 6 (ref 5.0–8.0)

## 2019-01-05 LAB — SARS CORONAVIRUS 2 BY RT PCR (HOSPITAL ORDER, PERFORMED IN ~~LOC~~ HOSPITAL LAB): SARS Coronavirus 2: NEGATIVE

## 2019-01-05 LAB — TROPONIN I (HIGH SENSITIVITY)
Troponin I (High Sensitivity): 29 ng/L — ABNORMAL HIGH (ref <18)
Troponin I (High Sensitivity): 32 ng/L — ABNORMAL HIGH (ref <18)
Troponin I (High Sensitivity): 31 ng/L — ABNORMAL HIGH (ref <18)
Troponin I (High Sensitivity): 29 ng/L — ABNORMAL HIGH (ref <18)

## 2019-01-05 LAB — CBC
HCT: 37.9 % (ref 36.0–46.0)
Hemoglobin: 12.1 g/dL (ref 12.0–15.0)
MCH: 29.9 pg (ref 26.0–34.0)
MCHC: 31.9 g/dL (ref 30.0–36.0)
MCV: 93.6 fL (ref 80.0–100.0)
Platelets: 106 10*3/uL — ABNORMAL LOW (ref 150–400)
RBC: 4.05 MIL/uL (ref 3.87–5.11)
RDW: 13.9 % (ref 11.5–15.5)
WBC: 3.6 10*3/uL — ABNORMAL LOW (ref 4.0–10.5)
nRBC: 0 % (ref 0.0–0.2)

## 2019-01-05 LAB — ETHANOL: Alcohol, Ethyl (B): <10 mg/dL (ref <10)

## 2019-01-05 LAB — CBG MONITORING, ED: Glucose-Capillary: 100 mg/dL — ABNORMAL HIGH (ref 70–99)

## 2019-01-05 MED ORDER — IOHEXOL 350 MG/ML SOLN
100.0000 mL | Freq: Once | INTRAVENOUS | Status: AC | PRN
  Administered 2019-01-05: 100 mL via INTRAVENOUS

## 2019-01-05 MED ORDER — ASPIRIN 300 MG RE SUPP: 300.0000 mg | Freq: Every day | RECTAL | Status: DC

## 2019-01-05 MED ORDER — PRAVASTATIN SODIUM 40 MG PO TABS: 40.0000 mg | ORAL_TABLET | Freq: Every day | ORAL | Status: DC

## 2019-01-05 MED ORDER — ONDANSETRON HCL 4 MG/2ML IJ SOLN: 4.0000 mg | Freq: Once | INTRAMUSCULAR | Status: DC

## 2019-01-05 MED ORDER — ACETAMINOPHEN 650 MG RE SUPP: 650.0000 mg | RECTAL | Status: DC | PRN

## 2019-01-05 MED ORDER — MORPHINE SULFATE (PF) 2 MG/ML IV SOLN
1.0000 mg | INTRAVENOUS | Status: DC | PRN
  Administered 2019-01-05: 1 mg via INTRAVENOUS
  Filled 2019-01-05: qty 1

## 2019-01-05 MED ORDER — MORPHINE SULFATE (PF) 4 MG/ML IV SOLN
4.0000 mg | Freq: Once | INTRAVENOUS | Status: AC
  Administered 2019-01-05: 4 mg via INTRAVENOUS
  Filled 2019-01-05: qty 1

## 2019-01-05 MED ORDER — ASPIRIN 325 MG PO TABS
325.0000 mg | ORAL_TABLET | Freq: Every day | ORAL | Status: DC
  Administered 2019-01-06: 325 mg via ORAL
  Filled 2019-01-05: qty 1

## 2019-01-05 MED ORDER — TRAZODONE HCL 50 MG PO TABS
100.0000 mg | ORAL_TABLET | Freq: Every day | ORAL | Status: DC
  Administered 2019-01-05: 100 mg via ORAL
  Filled 2019-01-05: qty 2

## 2019-01-05 MED ORDER — ESCITALOPRAM OXALATE 10 MG PO TABS
10.0000 mg | ORAL_TABLET | Freq: Every day | ORAL | Status: DC
  Administered 2019-01-05: 20:00:00 10 mg via ORAL
  Filled 2019-01-05: qty 1

## 2019-01-05 MED ORDER — STROKE: EARLY STAGES OF RECOVERY BOOK
Freq: Once | Status: DC
  Filled 2019-01-05: qty 1

## 2019-01-05 MED ORDER — CARBOXYMETHYLCELLULOSE SODIUM 0.25 % OP SOLN: 1.0000 [drp] | Freq: Two times a day (BID) | OPHTHALMIC | Status: DC

## 2019-01-05 MED ORDER — OXYBUTYNIN CHLORIDE ER 5 MG PO TB24
10.0000 mg | ORAL_TABLET | Freq: Every day | ORAL | Status: DC
  Administered 2019-01-06: 10 mg via ORAL
  Filled 2019-01-05 (×3): qty 1

## 2019-01-05 MED ORDER — ACETAMINOPHEN 160 MG/5ML PO SOLN: 650.0000 mg | ORAL | Status: DC | PRN

## 2019-01-05 MED ORDER — EZETIMIBE 10 MG PO TABS
10.0000 mg | ORAL_TABLET | Freq: Every day | ORAL | Status: DC
  Administered 2019-01-05: 10 mg via ORAL
  Filled 2019-01-05 (×4): qty 1

## 2019-01-05 MED ORDER — ACETAMINOPHEN 325 MG PO TABS
650.0000 mg | ORAL_TABLET | ORAL | Status: DC | PRN
  Administered 2019-01-06: 650 mg via ORAL
  Filled 2019-01-05: qty 2

## 2019-01-05 MED ORDER — SODIUM CHLORIDE 0.9% FLUSH
3.0000 mL | Freq: Once | INTRAVENOUS | Status: AC
  Administered 2019-01-05: 3 mL via INTRAVENOUS

## 2019-01-05 MED ORDER — NITROGLYCERIN 0.4 MG SL SUBL
0.4000 mg | SUBLINGUAL_TABLET | Freq: Once | SUBLINGUAL | Status: DC
  Filled 2019-01-05: qty 1

## 2019-01-05 MED ORDER — PSYLLIUM 95 % PO PACK
1.0000 | PACK | Freq: Every day | ORAL | Status: DC
  Administered 2019-01-06: 1 via ORAL
  Filled 2019-01-05 (×3): qty 1

## 2019-01-05 NOTE — ED Notes (Signed)
Pt would like to hold off on NTG sl for now.  Will informed nurse if chest is worst.

## 2019-01-05 NOTE — Consult Note (Signed)
Cardiology Consultation:   Patient ID: Robin Wolfe MRN: [id number]; DOB: [birth year]  Admit date: 01/05/2019 Date of Consult: 01/05/2019  Primary Care Provider: Celene Squibb, MD Primary Cardiologist: Dr Jolyn Nap Primary Electrophysiologist:  Dr Jolyn Nap   Patient Profile:   Robin Wolfe is a 63 y.o. female with a hx of bradycardia/pacemaker, nonobstructive CAD by cath 2016 who is being seen today for the evaluation of chest pain at the request of Dr Thurnell Garbe.  History of Present Illness:   Robin Wolfe 63 yo female history of HL, rectal CA s/p resection and chemo, sympomatic brady with pacemaker, right ICA aneurysm s/p clipping, admitted with chest pain and facial numbness.   Appears to have a history of prior chest pain. Extensive workup in 2016 with nuclear stress with normal imaging but abnormal EKG, cath later showed mild nonobstructive disease.  Chest pain symptoms reported during 2019 cardiology visit.    These most recent symptoms started about 1-1.5 weeks ago. Sharp pain that turns into pressure midchest radiating to back between shoulder blases. Can last up to 4-5 hours. Can have some SOB associated. Tends to come on with activity. Not positional. Can be better with iburprofen. She has had this type of pain before but not this severe.    Works as Merchandiser, retail   Hgb 12.1 Plt 106 K 3.4 Cr 0.9  hstrop 29--> EKG a paced, no specific ischemic changes.  COVID pending CXR no acute process CT head: no acute process CTA C/A/P: no dissection, no central PE. 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   Thrombocytopenia ([facility])    Ventral hernia     Past Surgical History:  Procedure Laterality Date   ABDOMINAL HYSTERECTOMY     endometriosis   CARDIAC CATHETERIZATION N/A 01/16/2015   Procedure: Left Heart Cath and Coronary Angiography;  Surgeon: Burnell Blanks, MD;  Location: [facility];  Service: Cardiovascular;  Laterality: N/A;   CEREBRAL ANEURYSM REPAIR     CHOLECYSTECTOMY     ileostomyw/ takedown 8/09 Dr Crisoforo Oxford   COLON SURGERY     COLONOSCOPY  11/13/2007   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, but on withdrawal of the scope, the anastomosis seemed to dilate  following the procedure/ Normal visualized colon with stool seen in the proximal colon   COLONOSCOPY  09/12/2008    Small rectal pouch, which made retroflexion challenging/ no polyps detected in the rectum/ No diverticula   COLONOSCOPY  01/21/2012   Procedure: COLONOSCOPY;  Surgeon: Danie Binder, MD;  [facility];  Service: Endoscopy;  Laterality: N/A;   COLONOSCOPY N/A 12/25/2017   Procedure: COLONOSCOPY;  Surgeon: Danie Binder, MD;  Location: [facility];  Service: Endoscopy;  Laterality: N/A;  1:00pm   EP IMPLANTABLE DEVICE N/A 02/08/2015   Procedure: Pacemaker Implant;  Surgeon: Deboraha Sprang, MD;  Location: [facility];  Service: Cardiovascular;  Laterality: N/A;   SIGMOIDOSCOPY  12/24/2006   rectal  adenocarcinoma/Normal retroflexed view of the rectum   VENTRAL HERNIA REPAIR      Inpatient Medications: Scheduled Meds:   stroke: mapping our early stages of recovery book   Does not apply Once   aspirin  300 mg Rectal Daily   Or   aspirin  325 mg Oral Daily   morphine  4 mg Intravenous Once   nitroGLYCERIN  0.4 mg Sublingual Once   ondansetron  4 mg Intravenous Once   sodium chloride flush  3  mL Intravenous Once   Continuous Infusions:  PRN Meds: acetaminophen **OR** acetaminophen (TYLENOL) oral liquid 160 mg/5 mL **OR** acetaminophen  Allergies:    Allergies  Allergen Reactions   Compazine Other (See Comments)    Reaction: seizures   Hydromorphone Hcl Other (See Comments)    REACTION: hallucinations and behavior issues   Suprep [Na Sulfate-K Sulfate-Mg Sulf]     Severe bloating and pain after first dose.     Social History:   Social History   Socioeconomic History   Marital status: Widowed    Spouse name: Not on file   Number of children: Not on file   Years of education: Not on file   Highest education level: Not on file  Occupational History   Not on file  Social Needs   Financial resource strain: Not on file   Food insecurity    Worry: Not on file    Inability: Not on file   Transportation needs    Medical: Not on file    Non-medical: Not on file  Tobacco Use   Smoking status: Former Smoker    Packs/day: 1.50    Years: 8.00    Pack years: 12.00    Types: Cigarettes    Quit date: 01/06/2007    Years since quitting: 12.0   Smokeless tobacco: Never Used  Substance and Sexual Activity   Alcohol use: Yes    Alcohol/week: 0.0 standard drinks    Comment: rarely   Drug use: No   Sexual activity: Yes    Birth control/protection: Surgical  Lifestyle   Physical activity    Days per week: Not on file    Minutes per session: Not on file   Stress: Not on file  Relationships   Social connections    Talks on phone: Not on file    Gets together: Not on file    Attends religious service: Not on file    Active member of club or organization: Not on file    Attends meetings of clubs or organizations: Not on file    Relationship status: Not on file   Intimate partner violence    Fear of current or ex partner: Not on file    Emotionally abused: Not on file    Physically abused: Not on file    Forced sexual activity: Not on file  Other  Topics Concern   Not on file  Social History Narrative   Not on file    Family History:    Family History  Problem Relation Age of Onset   Heart disease Other    Lung disease Other    Diabetes Other    Colon cancer Neg Hx      ROS:  Please see the history of present illness.  All other ROS reviewed and negative.     Physical Exam/Data:   Vitals:   01/05/19 1345 01/05/19 1400 01/05/19 1415 01/05/19 1430  BP: (!) 152/85 (!) 147/73 136/70 132/74  Pulse: 66 88  62 63  Resp: 13  16 18   Temp:      TempSrc:      SpO2: 94% 97% 95% 97%  Weight:      Height:       No intake or output data in the 24 hours ending 01/05/19 1537 Last 3 Weights 01/05/2019 12/25/2017 11/19/2017  Weight (lbs) 153 lb 153 lb 153 lb 6.4 oz  Weight (kg) 69.4 kg 69.4 kg 69.582 kg     Body mass index is 26.26 kg/m.  General:  Well nourished, well developed, in no acute distress HEENT: normal Lymph: no adenopathy Neck: no JVD Endocrine:  No thryomegaly Cardiac:  normal S1, S2; RRR; no murmur  Lungs:  clear to auscultation bilaterally, no wheezing, rhonchi or rales  Abd: soft, nontender, no hepatomegaly  Ext: no edema Musculoskeletal:  No deformities, BUE and BLE strength normal and equal Skin: warm and dry  Neuro:  CNs 2-12 intact, no focal abnormalities noted Psych:  Normal affect     Laboratory Data:  High Sensitivity Troponin:   Recent Labs  Lab 01/05/19 1149  TROPONINIHS 29*     Cardiac EnzymesNo results for input(s): TROPONINI in the last 168 hours. No results for input(s): TROPIPOC in the last 168 hours.  Chemistry Recent Labs  Lab 01/05/19 1149  NA 139  K 3.4*  CL 102  CO2 27  GLUCOSE 105*  BUN 10  CREATININE 0.90  CALCIUM 9.3  GFRNONAA >60  GFRAA >60  ANIONGAP 10    Recent Labs  Lab 01/05/19 1149  PROT 6.9  ALBUMIN 3.9  AST 20  ALT 19  ALKPHOS 63  BILITOT 0.6   Hematology Recent Labs  Lab 01/05/19 1149  WBC 3.6*  RBC 4.05  HGB 12.1  HCT 37.9  MCV 93.6   MCH 29.9  MCHC 31.9  RDW 13.9  PLT 106*   BNPNo results for input(s): BNP, PROBNP in the last 168 hours.  DDimer No results for input(s): DDIMER in the last 168 hours.   Radiology/Studies:  Dg Chest Portable 1 View  Result Date: 01/05/2019 CLINICAL DATA:  February 20, 2017 EXAM: PORTABLE CHEST 1 VIEW COMPARISON:  February 20, 2017 FINDINGS: No edema or consolidation. Heart is upper normal in size with pulmonary vascularity normal. Pacemaker leads are attached to the right atrium and right ventricle. There is aortic atherosclerosis. No adenopathy. No bone lesions. IMPRESSION: No acute cardiopulmonary finding. Pacemaker leads attached to right atrium and right ventricle. Heart upper normal in size. No edema or consolidation. Aortic Atherosclerosis (ICD10-I70.0). Electronically Signed   By: Lowella Grip III M.D.   On: 01/05/2019 11:32   Ct Angio Chest/abd/pel For Dissection W And/or Wo Contrast  Result Date: 01/05/2019 CLINICAL DATA:  Chest pain, question of aortic dissection mid upper back pain for 1 and half week Radian thick chest EXAM: CT ANGIOGRAPHY CHEST, ABDOMEN AND PELVIS TECHNIQUE: Multidetector CT imaging through the chest, abdomen and pelvis was performed using the standard protocol during bolus administration of intravenous contrast. Multiplanar reconstructed images and MIPs were obtained and reviewed to evaluate the vascular anatomy. CONTRAST:  150mL OMNIPAQUE IOHEXOL 350 MG/ML SOLN COMPARISON:  None. FINDINGS: CTA CHEST FINDINGS Cardiovascular: --Heart: The heart size is mildly enlarged. There is nopericardial effusion. --Aorta: The course and caliber of the thoracic aorta are normal. There is mild atherosclerotic calcifications seen at the aortic arch and descending intrathoracic aorta. Precontrast images show no aortic intramural hematoma. There is no blood pool, dissection or penetrating ulcer demonstrated on  arterial phase postcontrast imaging. There is a conventional 3 vessel  aortic arch branching pattern. There is a mild amount of calcification seen at the origin of the great vessels. --Pulmonary Arteries: Contrast timing is optimized for preferential opacification of the aorta. Within that limitation, normal central pulmonary arteries. Mediastinum/Nodes: No mediastinal, hilar or axillary lymphadenopathy. The visualized thyroid and thoracic esophageal course are unremarkable. Lungs/Pleura: No pulmonary nodules or masses. No pleural effusion or pneumothorax. No focal airspace consolidation. No focal pleural abnormality. Musculoskeletal: A left-sided pacemaker is seen with lead tips in the right atrium and right ventricle. No chest wall abnormality. No acute osseous findings. Review of the MIP images confirms the above findings. CTA ABDOMEN AND PELVIS FINDINGS VASCULAR Aorta: Normal caliber aorta without aneurysm, dissection, vasculitis or hemodynamically significant stenosis. There is calcified atherosclerosis and noncalcified thrombus seen within the intra-abdominal aorta predominantly within the infrarenal portion. The maximum diameter of the infrarenal portion of the abdominal aorta is 1.7 cm. Celiac: No aneurysm, dissection or hemodynamically significant stenosis. Normal branching pattern. SMA: Widely patent without dissection or stenosis. Renals: Single renal arteries bilaterally. Atherosclerosis seen at the origins of the single bilateral renal arteries without significant stenosis. No aneurysm, dissection, stenosis or evidence of fibromuscular dysplasia. IMA: Patent without abnormality. Inflow: No aneurysm, stenosis or dissection. Review of the MIP images confirms the above findings. NON-VASCULAR Hepatobiliary: Normal hepatic contours and density. No visible biliary dilatation. The patient is status post cholecystectomy. No biliary ductal dilation. Pancreas: Normal contours without ductal dilatation. No peripancreatic fluid collection. Spleen: Normal arterial phase splenic  enhancement pattern. Adrenals/Urinary Tract: --Adrenal glands: Normal. --Right kidney/ureter: There is a tube cm low-density lesion seen within the upper pole of the right kidney. No hydronephrosis is seen. --Left kidney/ureter: No hydronephrosis or perinephric stranding. No nephrolithiasis. No obstructing ureteral stones. --Urinary bladder: Unremarkable. Stomach/Bowel: --Stomach/Duodenum: No hiatal hernia or other gastric abnormality. Normal duodenal course and caliber. --Small bowel: No dilatation or inflammation. --Colon: There is surgical anastomosis seen at the sigmoid rectal junction. Surrounding mild perirectal thickening is noted, likely postsurgical changes. There is a decompressed sigmoid colon with apparent bowel wall thickening. --Appendix: Not seen. Lymphatic:  No abdominal or pelvic lymphadenopathy. Reproductive: No free fluid in the pelvis. The patient is status post hysterectomy. Musculoskeletal. No bony spinal canal stenosis or focal osseous abnormality. Other: Abdominal wall mesh hernia is seen. There is a upper small anterior abdominal wall fat containing hernia measuring 3.9 cm in length. Review of the MIP images confirms the above findings. IMPRESSION: 1. No acute intrathoracic aortic abnormality. Mild amount of atherosclerotic plaque. 2. No central or segmental pulmonary embolism. 3. Calcified atherosclerosis and noncalcified thrombus seen within the infrarenal abdominal aorta without evidence of aneurysmal dilatation or other acute abnormality. Patent celiac, SMA, and IMA. 4. Status post partial colectomy with the anastomosis seen in the rectum. Mild perirectal soft tissue thickening within adjacent apparent sigmoid colonic wall thickening. This could be due to postsurgical/post radiation changes. If further evaluation is required would recommend MRI pelvis or correlation with prior exam. 5. Anterior abdominal wall fat containing hernia. Electronically Signed   By: Prudencio Pair M.D.   On:  01/05/2019 15:01   Ct Head Code Stroke Wo Contrast  Result Date: 01/05/2019 CLINICAL DATA:  Code stroke. Right facial numbness beginning 2 hours ago. Personal history of intracranial aneurysm treated in 1998. EXAM: CT HEAD WITHOUT CONTRAST TECHNIQUE: Contiguous axial images were obtained from the base of the skull through the vertex without intravenous contrast. COMPARISON:  CT  head without contrast 01/30/2015 and 11/07/2011 FINDINGS: Brain: Patient is status post right pterional craniotomy. There is chronic encephalomalacia involving the right frontal lobe. Aneurysm clips are stable in position. No acute infarct, hemorrhage, or mass lesion is present. Basal ganglia are within normal limits. Insular ribbon is normal bilaterally. No acute or focal cortical abnormalities are present. No significant white matter lesions are present. The ventricles are of normal size. No significant extraaxial fluid collection is present. Vascular: Right ICA aneurysm clip is in place. Minimal atherosclerotic calcifications are present within the cavernous internal carotid arteries without significant interval change. There is no hyperdense vessel. Skull: Right pterional craniotomy. Calvarium is otherwise intact. No focal lytic or blastic lesions are present. No significant extracranial soft tissue injury is present. Sinuses/Orbits: The paranasal sinuses and mastoid air cells are clear. The globes and orbits are within normal limits. ASPECTS [facility] Stroke Program Early CT Score) - Ganglionic level infarction (caudate, lentiform nuclei, internal capsule, insula, M1-M3 cortex): 7/7 - Supraganglionic infarction (M4-M6 cortex): 3/3 Total score (0-10 with 10 being normal): 10/10 IMPRESSION: 1. No acute intracranial abnormality or significant interval change 2. 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 3. Right ICA aneurysm clipping is stable 4. ASPECTS is 10/10 These results were called by telephone at the time of  interpretation on 01/05/2019 at 12:01 pm to Dr. Francine Graven , who verbally acknowledged these results. Electronically Signed   By: San Morelle M.D.   On: 01/05/2019 12:01    Assessment and Plan:   1. Chest pain - prior history of chest pains with cath 2016 with nonobstructive disease - current symptoms atypical in the sense they can last up to 4-5 hours constant. This current episode started around 9AM this morning, constant going on 7 hours.  - EKG without specific ischemic changes, mild trop not specific at this time - follow EKG and trop trend, obtain echo - make npo tonight in case further testing is indicated. Would need to follow up on the final thoughts about a possible TIA/CVA by neuro before commiting to ischemic testing - CTA was negative without acute aortic pathology. Some atherosclerosis and associated thrombus in the distal aorta.  2. Facial numbness - workup per primary team and neuro, undergoing possibly TIA/stroke eval  3. Pacemaker - no issues at this time, currently A-paced    For questions or updates, please contact [facility] Please consult [email or web address] for contact info under     Signed, Carlyle Dolly, MD  01/05/2019 3:37 PM

## 2019-01-05 NOTE — ED Notes (Signed)
Refuses NTG.

## 2019-01-05 NOTE — ED Provider Notes (Signed)
[facility] EMERGENCY DEPARTMENT Provider Note   CSN: [id number] Arrival date & time: 01/05/19  1056  An emergency department physician performed an initial assessment on this suspected stroke patient at 63.  History   Chief Complaint Chief Complaint  Patient presents with   Chest Pain   Facial Numbness    HPI Robin Wolfe is a 63 y.o. female possible history of hypercholesteremia, rectal adenocarcinoma (currently in remission) cardiac pacemaker, symptomatic bradycardia, thrombocytopenia who presents today for evaluation of chest pain and right facial numbness.  She reports that symptoms began at about 9:30 AM when she noticed a severe, sharp chest pain in the midsternal of her chest that she states radiated through to her back.  She states that she got very short of breath and dizzy with the pain.  She reports that about 10 AM, she started noticing that she was having some numbness noted to the right face only.  No extremity numbness/weakness.  She did not get very diaphoretic or nauseous with this pain.  She asked a coworker to drive her to the hospital and noted that when she got in the car, she felt like she was leaning to her right side.  She did take 81 mg aspirin x4 prior to coming to the ED.  She has not tried any other medications.  Currently in the ED reports a pain that is 4/10 and describes as a pressure type pain.  She reports that over the last week, she has had intermittent episodes of chest pain.  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  She states that she would get short of breath with the associated pain.  She never took any medication for the pain.  She states that when she went home and rested, the pain would stop.  Today was the first day that the pain became severe.  She denies any recent sicknesses, fevers, cough, chills.  No recent travel, known COVID-19 exposure.  She denies any vision  changes, slurred speech, facial droop, numbness/weakness of her arms or legs, abdominal pain, vomiting.  She has a history of hypercholesteremia.  She reports history of symptomatic bradycardic for which she required a pacemaker but otherwise denies any cardiac history.  Does report that dad had cardiovascular disease and she had an uncle with a heart attack in his 68s.  She does not smoke.  Denies any history of hypertension, diabetes.  HPI: A 63 year old patient with a history of hypercholesterolemia presents for evaluation of chest pain. Initial onset of pain was approximately 1-3 hours ago. The patient's chest pain is described as heaviness/pressure/tightness and is worse with exertion. The patient's chest pain is middle- or left-sided, is not well-localized, is not sharp and does not radiate to the arms/jaw/neck. The patient does not complain of nausea and denies diaphoresis. The patient has a family history of coronary artery disease in a first-degree relative with onset less than age 73. The patient has no history of stroke, has no history of peripheral artery disease, has not smoked in the past 90 days, denies any history of treated diabetes, is not hypertensive and does not have an elevated BMI (>=30).   The history is provided by the patient.    Past Medical History:  Diagnosis Date   Hypercholesteremia    Insomnia    Rectal adenocarcinoma ([place]) 2008   Stage III, T2N1M0, s/p resection/chemo  S/P placement of cardiac pacemaker, 02/08/15, Biotronik, MIR compatibile  02/09/2015   Symptomatic sinus bradycardia 02/09/2015   Thrombocytopenia (HCC)    Ventral hernia     Patient Active Problem List   Diagnosis Date Noted   Chest pain 01/05/2019   Right facial numbness 01/05/2019   Abdominal bloating with cramps 11/19/2017   Atypical chest pain 02/21/2017   Symptomatic sinus bradycardia 02/09/2015   S/P placement of cardiac pacemaker, 02/08/15, Biotronik, MIR compatibile  02/09/2015     Sinus node dysfunction ([place]) 02/08/2015   Abnormal cardiovascular function    Insomnia    Rectal adenocarcinoma ([place])    Radial head fracture, closed 12/31/2010   Pain in left elbow 12/31/2010   ADENOCARCINOMA, RECTOSIGMOID COLON 09/07/2008    Past Surgical History:  Procedure Laterality Date   ABDOMINAL HYSTERECTOMY     endometriosis   CARDIAC CATHETERIZATION N/A 01/16/2015   Procedure: Left Heart Cath and Coronary Angiography;  Surgeon: Burnell Blanks, MD;  Location: [facility];  Service: Cardiovascular;  Laterality: N/A;   CEREBRAL ANEURYSM REPAIR     CHOLECYSTECTOMY     ileostomyw/ takedown 8/09 Dr Crisoforo [place]   COLON SURGERY     COLONOSCOPY  11/13/2007   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, but on withdrawal of the scope, the anastomosis seemed to dilate  following the procedure/ Normal visualized colon with stool seen in the proximal colon   COLONOSCOPY  09/12/2008    Small rectal pouch, which made retroflexion challenging/ no polyps detected in the rectum/ No diverticula   COLONOSCOPY  01/21/2012   Procedure: COLONOSCOPY;  Surgeon: Danie Binder, MD;  Location: [facility];  Service: Endoscopy;  Laterality: N/A;   COLONOSCOPY N/A 12/25/2017   Procedure: COLONOSCOPY;  Surgeon: Danie Binder, MD;  Location: [facility];  Service: Endoscopy;  Laterality: N/A;  1:00pm   EP IMPLANTABLE DEVICE N/A 02/08/2015   Procedure: Pacemaker Implant;  Surgeon: Deboraha Sprang, MD;  Location: [facility];  Service: Cardiovascular;  Laterality: N/A;   SIGMOIDOSCOPY  12/24/2006   rectal adenocarcinoma/Normal retroflexed view of the rectum   VENTRAL HERNIA REPAIR       OB History   No obstetric history on file.      Home Medications    Prior to Admission medications   Medication Sig Start Date End Date Taking? Authorizing Provider  Carboxymethylcellulose Sodium (THERATEARS) 0.25 % SOLN Place 1 drop into both eyes 2  (two) times daily.   Yes [provider]  escitalopram (LEXAPRO) 10 MG tablet Take 10 mg by mouth at bedtime. 11/14/17  Yes [provider]  ezetimibe (ZETIA) 10 MG tablet Take 1 tablet (10 mg total) by mouth daily. Patient taking differently: Take 10 mg by mouth at bedtime.  06/26/15  Yes Skeet Latch, MD  ibuprofen (ADVIL,MOTRIN) 200 MG tablet Take 400 mg by mouth every 8 (eight) hours as needed (for pain.).    Yes [provider]  OVER THE COUNTER MEDICATION TOTAL RESTORE for the gut 3 capsule daily   Yes [provider]  oxybutynin (DITROPAN-XL) 10 MG 24 hr tablet Take 10 mg by mouth daily.  02/04/17  Yes [provider]  psyllium (METAMUCIL) 58.6 % powder Take 1 packet by mouth daily.   Yes [provider]  traZODone (DESYREL) 100 MG tablet Take 100 mg by mouth at bedtime.  01/28/17  Yes [provider]  Na Sulfate-K Sulfate-Mg Sulf 17.5-3.13-1.6 GM/177ML SOLN Take 1 kit  by mouth as directed. Patient not taking: Reported on 01/05/2019 11/19/17   Danie Binder, MD    Family History Family History  Problem Relation Age of Onset   Heart disease Other    Lung disease Other    Diabetes Other    Hyperlipidemia Mother    CAD Father    Stroke Father    Colon cancer Neg Hx     Social History Social History   Tobacco Use   Smoking status: Former Smoker    Packs/day: 1.50    Years: 8.00    Pack years: 12.00    Types: Cigarettes    Quit date: 01/06/2007    Years since quitting: 12.0   Smokeless tobacco: Never Used  Substance Use Topics   Alcohol use: Yes    Alcohol/week: 0.0 standard drinks    Comment: rarely   Drug use: No     Allergies   Compazine, Hydromorphone hcl, and Suprep [na sulfate-k sulfate-mg sulf]   Review of Systems Review of Systems  Constitutional: Negative for fever.  Respiratory: Positive for shortness of breath. Negative for cough.   Cardiovascular: Positive for chest pain.   Gastrointestinal: Negative for abdominal pain, nausea and vomiting.  Genitourinary: Negative for dysuria and hematuria.  Neurological: Positive for numbness (Right face). Negative for weakness and headaches.  All other systems reviewed and are negative.    Physical Exam Updated Vital Signs BP 113/65    Pulse 65    Temp (!) 97.5 F (36.4 C) (Oral)    Resp 17    Ht 5' 4" (1.626 m)    Wt 69.4 kg    SpO2 96%    BMI 26.26 kg/m   Physical Exam Vitals signs and nursing note reviewed.  Constitutional:      Appearance: Normal appearance. She is well-developed.  HENT:     Head: Normocephalic and atraumatic.  Eyes:     General: Lids are normal.     Conjunctiva/sclera: Conjunctivae normal.     Pupils: Pupils are equal, round, and reactive to light.  Neck:     Musculoskeletal: Full passive range of motion without pain.  Cardiovascular:     Rate and Rhythm: Normal rate and regular rhythm.     Pulses: Normal pulses.          Radial pulses are 2+ on the right side and 2+ on the left side.       Dorsalis pedis pulses are 2+ on the right side and 2+ on the left side.     Heart sounds: Normal heart sounds. No murmur. No friction rub. No gallop.   Pulmonary:     Effort: Pulmonary effort is normal.     Breath sounds: Normal breath sounds.     Comments: Lungs clear to auscultation bilaterally.  Symmetric chest rise.  No wheezing, rales, rhonchi. Chest:     Comments: Pain not reproduced with palpation of chest. Abdominal:     Palpations: Abdomen is soft. Abdomen is not rigid.     Tenderness: There is no abdominal tenderness. There is no guarding.     Comments: Abdomen is soft, non-distended, non-tender. No rigidity, No guarding. No peritoneal signs.  Musculoskeletal: Normal range of motion.  Skin:    General: Skin is warm and dry.     Capillary Refill: Capillary refill takes less than 2 seconds.  Neurological:     Mental Status: She is alert and oriented to person, place, and time.      Comments: Cranial  nerves III-XII intact Follows commands, Moves all extremities  5/5 strength to BUE and BLE  Reports decreased sensation noted to V1, V2, V3 of trigeminal nerve of face on the right side only.  Sensation otherwise intact throughout all other major dermatomes. Normal coordination No pronator drift. No gait abnormalities  No slurred speech. No facial droop.   Psychiatric:        Speech: Speech normal.      ED Treatments / Results  Labs (all labs ordered are listed, but only abnormal results are displayed) Labs Reviewed  CBC - Abnormal; Notable for the following components:      Result Value   WBC 3.6 (*)    Platelets 106 (*)    All other components within normal limits  APTT - Abnormal; Notable for the following components:   aPTT 38 (*)    All other components within normal limits  COMPREHENSIVE METABOLIC PANEL - Abnormal; Notable for the following components:   Potassium 3.4 (*)    Glucose, Bld 105 (*)    All other components within normal limits  URINALYSIS, ROUTINE W REFLEX MICROSCOPIC - Abnormal; Notable for the following components:   Leukocytes,Ua TRACE (*)    All other components within normal limits  CBG MONITORING, ED - Abnormal; Notable for the following components:   Glucose-Capillary 100 (*)    All other components within normal limits  TROPONIN I (HIGH SENSITIVITY) - Abnormal; Notable for the following components:   Troponin I (High Sensitivity) 29 (*)    All other components within normal limits  TROPONIN I (HIGH SENSITIVITY) - Abnormal; Notable for the following components:   Troponin I (High Sensitivity) 32 (*)    All other components within normal limits  TROPONIN I (HIGH SENSITIVITY) - Abnormal; Notable for the following components:   Troponin I (High Sensitivity) 31 (*)    All other components within normal limits  SARS CORONAVIRUS 2 (HOSPITAL ORDER, [facility])  ETHANOL  PROTIME-INR  DIFFERENTIAL  RAPID  URINE DRUG SCREEN, HOSP PERFORMED  HIV ANTIBODY (ROUTINE TESTING W REFLEX)  HEMOGLOBIN A1C  LIPID PANEL    EKG EKG Interpretation  Date/Time:  Tuesday January 05 2019 11:08:36 EDT Ventricular Rate:  69 PR Interval:    QRS Duration: 120 QT Interval:  421 QTC Calculation: 451 R Axis:   -11 Text Interpretation:  Atrial-paced rhythm Nonspecific intraventricular conduction delay Anteroseptal infarct, old Nonspecific T abnormalities, lateral leads Baseline wander When compared with ECG of 02/20/2017 No significant change was found Confirmed by Francine Graven [phone]) on 01/05/2019 12:05:22 PM   Radiology Dg Chest Portable 1 View  Result Date: 01/05/2019 CLINICAL DATA:  February 20, 2017 EXAM: PORTABLE CHEST 1 VIEW COMPARISON:  February 20, 2017 FINDINGS: No edema or consolidation. Heart is upper normal in size with pulmonary vascularity normal. Pacemaker leads are attached to the right atrium and right ventricle. There is aortic atherosclerosis. No adenopathy. No bone lesions. IMPRESSION: No acute cardiopulmonary finding. Pacemaker leads attached to right atrium and right ventricle. Heart upper normal in size. No edema or consolidation. Aortic Atherosclerosis (ICD10-I70.0). Electronically Signed   By: Lowella Grip III M.D.   On: 01/05/2019 11:32   Ct Angio Chest/abd/pel For Dissection W And/or Wo Contrast  Result Date: 01/05/2019 CLINICAL DATA:  Chest pain, question of aortic dissection mid upper back pain for 1 and half week Radian thick chest EXAM: CT ANGIOGRAPHY CHEST, ABDOMEN AND PELVIS TECHNIQUE: Multidetector CT imaging through the chest, abdomen and pelvis was  performed using the standard protocol during bolus administration of intravenous contrast. Multiplanar reconstructed images and MIPs were obtained and reviewed to evaluate the vascular anatomy. CONTRAST:  18m OMNIPAQUE IOHEXOL 350 MG/ML SOLN COMPARISON:  None. FINDINGS: CTA CHEST FINDINGS Cardiovascular: --Heart: The heart size  is mildly enlarged. There is nopericardial effusion. --Aorta: The course and caliber of the thoracic aorta are normal. There is mild atherosclerotic calcifications seen at the aortic arch and descending intrathoracic aorta. Precontrast images show no aortic intramural hematoma. There is no blood pool, dissection or penetrating ulcer demonstrated on arterial phase postcontrast imaging. There is a conventional 3 vessel aortic arch branching pattern. There is a mild amount of calcification seen at the origin of the great vessels. --Pulmonary Arteries: Contrast timing is optimized for preferential opacification of the aorta. Within that limitation, normal central pulmonary arteries. Mediastinum/Nodes: No mediastinal, hilar or axillary lymphadenopathy. The visualized thyroid and thoracic esophageal course are unremarkable. Lungs/Pleura: No pulmonary nodules or masses. No pleural effusion or pneumothorax. No focal airspace consolidation. No focal pleural abnormality. Musculoskeletal: A left-sided pacemaker is seen with lead tips in the right atrium and right ventricle. No chest wall abnormality. No acute osseous findings. Review of the MIP images confirms the above findings. CTA ABDOMEN AND PELVIS FINDINGS VASCULAR Aorta: Normal caliber aorta without aneurysm, dissection, vasculitis or hemodynamically significant stenosis. There is calcified atherosclerosis and noncalcified thrombus seen within the intra-abdominal aorta predominantly within the infrarenal portion. The maximum diameter of the infrarenal portion of the abdominal aorta is 1.7 cm. Celiac: No aneurysm, dissection or hemodynamically significant stenosis. Normal branching pattern. SMA: Widely patent without dissection or stenosis. Renals: Single renal arteries bilaterally. Atherosclerosis seen at the origins of the single bilateral renal arteries without significant stenosis. No aneurysm, dissection, stenosis or evidence of fibromuscular dysplasia. IMA: Patent  without abnormality. Inflow: No aneurysm, stenosis or dissection. Review of the MIP images confirms the above findings. NON-VASCULAR Hepatobiliary: Normal hepatic contours and density. No visible biliary dilatation. The patient is status post cholecystectomy. No biliary ductal dilation. Pancreas: Normal contours without ductal dilatation. No peripancreatic fluid collection. Spleen: Normal arterial phase splenic enhancement pattern. Adrenals/Urinary Tract: --Adrenal glands: Normal. --Right kidney/ureter: There is a tube cm low-density lesion seen within the upper pole of the right kidney. No hydronephrosis is seen. --Left kidney/ureter: No hydronephrosis or perinephric stranding. No nephrolithiasis. No obstructing ureteral stones. --Urinary bladder: Unremarkable. Stomach/Bowel: --Stomach/Duodenum: No hiatal hernia or other gastric abnormality. Normal duodenal course and caliber. --Small bowel: No dilatation or inflammation. --Colon: There is surgical anastomosis seen at the sigmoid rectal junction. Surrounding mild perirectal thickening is noted, likely postsurgical changes. There is a decompressed sigmoid colon with apparent bowel wall thickening. --Appendix: Not seen. Lymphatic:  No abdominal or pelvic lymphadenopathy. Reproductive: No free fluid in the pelvis. The patient is status post hysterectomy. Musculoskeletal. No bony spinal canal stenosis or focal osseous abnormality. Other: Abdominal wall mesh hernia is seen. There is a upper small anterior abdominal wall fat containing hernia measuring 3.9 cm in length. Review of the MIP images confirms the above findings. IMPRESSION: 1. No acute intrathoracic aortic abnormality. Mild amount of atherosclerotic plaque. 2. No central or segmental pulmonary embolism. 3. Calcified atherosclerosis and noncalcified thrombus seen within the infrarenal abdominal aorta without evidence of aneurysmal dilatation or other acute abnormality. Patent celiac, SMA, and IMA. 4. Status  post partial colectomy with the anastomosis seen in the rectum. Mild perirectal soft tissue thickening within adjacent apparent sigmoid colonic wall thickening. This could be due to postsurgical/post  radiation changes. If further evaluation is required would recommend MRI pelvis or correlation with prior exam. 5. Anterior abdominal wall fat containing hernia. Electronically Signed   By: Prudencio Pair M.D.   On: 01/05/2019 15:01   Ct Head Code Stroke Wo Contrast  Result Date: 01/05/2019 CLINICAL DATA:  Code stroke. Right facial numbness beginning 2 hours ago. Personal history of intracranial aneurysm treated in 1998. EXAM: CT HEAD WITHOUT CONTRAST TECHNIQUE: Contiguous axial images were obtained from the base of the skull through the vertex without intravenous contrast. COMPARISON:  CT head without contrast 01/30/2015 and 11/07/2011 FINDINGS: Brain: Patient is status post right pterional craniotomy. There is chronic encephalomalacia involving the right frontal lobe. Aneurysm clips are stable in position. No acute infarct, hemorrhage, or mass lesion is present. Basal ganglia are within normal limits. Insular ribbon is normal bilaterally. No acute or focal cortical abnormalities are present. No significant white matter lesions are present. The ventricles are of normal size. No significant extraaxial fluid collection is present. Vascular: Right ICA aneurysm clip is in place. Minimal atherosclerotic calcifications are present within the cavernous internal carotid arteries without significant interval change. There is no hyperdense vessel. Skull: Right pterional craniotomy. Calvarium is otherwise intact. No focal lytic or blastic lesions are present. No significant extracranial soft tissue injury is present. Sinuses/Orbits: The paranasal sinuses and mastoid air cells are clear. The globes and orbits are within normal limits. ASPECTS [facility] Stroke Program Early CT Score) - Ganglionic level infarction (caudate,  lentiform nuclei, internal capsule, insula, M1-M3 cortex): 7/7 - Supraganglionic infarction (M4-M6 cortex): 3/3 Total score (0-10 with 10 being normal): 10/10 IMPRESSION: 1. No acute intracranial abnormality or significant interval change 2. 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 3. Right ICA aneurysm clipping is stable 4. ASPECTS is 10/10 These results were called by telephone at the time of interpretation on 01/05/2019 at 12:01 pm to Dr. Francine Graven , who verbally acknowledged these results. Electronically Signed   By: San Morelle M.D.   On: 01/05/2019 12:01    Procedures Procedures (including critical care time)  Medications Ordered in ED Medications  nitroGLYCERIN (NITROSTAT) SL tablet 0.4 mg (0.4 mg Sublingual Not Given 01/05/19 1906)  ondansetron (ZOFRAN) injection 4 mg (4 mg Intravenous Not Given 01/05/19 1906)   stroke: mapping our early stages of recovery book (has no administration in time range)  acetaminophen (TYLENOL) tablet 650 mg (has no administration in time range)    Or  acetaminophen (TYLENOL) solution 650 mg (has no administration in time range)    Or  acetaminophen (TYLENOL) suppository 650 mg (has no administration in time range)  aspirin suppository 300 mg ( Rectal See Alternative 01/05/19 1823)    Or  aspirin tablet 325 mg (325 mg Oral Not Given 01/05/19 1823)  ezetimibe (ZETIA) tablet 10 mg (10 mg Oral Given 01/05/19 2041)  escitalopram (LEXAPRO) tablet 10 mg (10 mg Oral Given 01/05/19 2026)  traZODone (DESYREL) tablet 100 mg (100 mg Oral Given 01/05/19 2026)  psyllium (HYDROCIL/METAMUCIL) packet 1 packet (1 packet Oral Not Given 01/05/19 2029)  oxybutynin (DITROPAN-XL) 24 hr tablet 10 mg (10 mg Oral Not Given 01/05/19 2029)  Carboxymethylcellulose Sodium 0.25 % SOLN 1 drop (1 drop Both Eyes Not Given 01/05/19 2030)  pravastatin (PRAVACHOL) tablet 40 mg (has no administration in time range)  morphine 2 MG/ML injection 1 mg (1 mg Intravenous Given  01/05/19 2042)  sodium chloride flush (NS) 0.9 % injection 3 mL (3 mLs Intravenous Given 01/05/19 1542)  iohexol (OMNIPAQUE) 350 MG/ML injection 100 mL (100 mLs Intravenous Contrast Given 01/05/19 1351)  morphine 4 MG/ML injection 4 mg (4 mg Intravenous Given 01/05/19 1543)     Initial Impression / Assessment and Plan / ED Course  I have reviewed the triage vital signs and the nursing notes.  Pertinent labs & imaging results that were available during my care of the patient were reviewed by me and considered in my medical decision making (see chart for details).     HEAR Score: 91  63 year old female who presents for evaluation of chest pain and right-sided facial numbness that began approximately 930-10 AM this morning.  Has had a weeklong history of intermittent chest pain while at work that resolves at rest when she gets home.  No extremity weakness, vision changes.  On initial ED arrival, she is afebrile but is hypertensive.  Vitals otherwise stable.  On exam, she does report decrease sensation noted to right face.  No other neuro deficits noted on exam.  Currently with 4/10 chest pain that she describes as a pressure that radiates into her back.  No abdominal tenderness.  Equal pulses in all 4 extremities.  Concern for ACS etiology given history of weeklong chest pain.  Also concern for CVA versus dissection.  On initial evaluation by RN, code stroke was initiated given complaints of numbness.  Patient was evaluated by tele-neuro which gave her an NIH score of 1.  No indication for TPA at this time.  Patient cannot get an MRI secondary to old right aneurysm clipping.  Neuro recommends observation, vascular study as well starting antiplatelet therapy.  Please see his note.  Given concerns for chest pain as well as sensory deficit and the fact that she states that pain radiates into her back, plan for dissection study for evaluation.  CMP shows potassium of 3.4.  Otherwise unremarkable.  Troponin is 29.   Ethanol is unremarkable.  CBC shows leukopenia of 3.6.  Platelets are 106.  INR 1.0.  Given patient's risk factors, history/physical exam, she has a heart score of 4.  Discussed patient with Dr. Harl Bowie (cardiology) regarding patient's story and trop of 29. Agrees with admission and feels like patient can stay at [facility]. Will plan to consult.   CTA shows no evidence of dissection. Plan for admission for ACS rule out, stroke eval.  Discussed patient with Dr. Maudie Mercury [facility]) who accepts patient for admission.   Portions of this note were generated with [employer]. Dictation errors may occur despite best attempts at proofreading.   Final Clinical Impressions(s) / ED Diagnoses   Final diagnoses:  Unstable angina [facility])  Right facial numbness    ED Discharge Orders    None       Desma Mcgregor 01/05/19 2116    Francine Graven, DO 01/09/19 [phone]

## 2019-01-05 NOTE — H&P (Signed)
TRH H&P    Patient Demographics:    Robin Wolfe, is a 63 y.o. female  MRN: [id number]  DOB - 13-Jan-1956  Admit Date - 01/05/2019  Referring MD/NP/PA: Cherlynn Kaiser  Outpatient Primary MD for the patient is Celene Squibb, MD  Patient coming from:  home  Chief complaint-  Chest pain    HPI:    Robin Wolfe  is a 63 y.o. female, w h/o rectal adenocarcinoma s/p chemo/ xrt, hyperlipidemia, anxiety, h/o bradycardia s/p pacer, CAD s/p  cardiac catheterization 01/16/2015-> prox LAD 30%, 1st diag 30%  , c/o chest pain starting this morning at rest.  Pt states pain "sharp" right sided and radiating to the back. Pt notes has had intermittent pain x 1 week.  Pt denies fever, chills, cough, palp, n/v, abd pain, diarrhea, brbpr, black stool.  Pt then states that she had right sided facial numbness about 2 hours prior to coming to ED.    In Ed,  T 97.5,  P 69  R 20  Pox 100% on RA Wt 69.4 kg  CT brain IMPRESSION: 1. No acute intracranial abnormality or significant interval change 2. 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 3. Right ICA aneurysm clipping is stable 4. ASPECTS is 10/10  CTA chest/ abd/ pelvis IMPRESSION: 1. No acute intrathoracic aortic abnormality. Mild amount of atherosclerotic plaque. 2. No central or segmental pulmonary embolism. 3. Calcified atherosclerosis and noncalcified thrombus seen within the infrarenal abdominal aorta without evidence of aneurysmal dilatation or other acute abnormality. Patent celiac, SMA, and IMA. 4. Status post partial colectomy with the anastomosis seen in the rectum. Mild perirectal soft tissue thickening within adjacent apparent sigmoid colonic wall thickening. This could be due to postsurgical/post radiation changes. If further evaluation is required would recommend MRI pelvis or correlation with prior exam. 5. Anterior abdominal wall fat  containing hernia.  CXR IMPRESSION: No acute cardiopulmonary finding.  Pacemaker leads attached to right atrium and right ventricle. Heart upper normal in size. No edema or consolidation. Aortic Atherosclerosis (ICD10-I70.0).  Urinalysis negative  Wbc 3.6, Hgb 12.1, Plt 106 Na 139, K 3.4, Bun 10, Creatinine 0.90  PTT 38  Trop 29 -> 32  Corona virus negative  Pt will be admitted for w/up of chest pain as well as right facial numbness.     Review of systems:    In addition to the HPI above,  No Fever-chills, No Headache, No changes with Vision or hearing, No problems swallowing food or Liquids, No Cough or Shortness of Breath, No Abdominal pain, No Nausea or Vomiting, bowel movements are regular, No Blood in stool or Urine, No dysuria, No new skin rashes or bruises, No new joints pains-aches,  No new weakness, tingling, numbness in any extremity, No recent weight gain or loss, No polyuria, polydypsia or polyphagia, No significant Mental Stressors.  All other systems reviewed and are negative.    Past History of the following :    Past Medical History:  Diagnosis Date   Hypercholesteremia    Insomnia  Rectal adenocarcinoma ([facility]) 2008   Stage III, T2N1M0, s/p resection/chemo   S/P placement of cardiac pacemaker, 02/08/15, Biotronik, MIR compatibile  02/09/2015   Symptomatic sinus bradycardia 02/09/2015   Thrombocytopenia ([facility])    Ventral hernia       Past Surgical History:  Procedure Laterality Date   ABDOMINAL HYSTERECTOMY     endometriosis   CARDIAC CATHETERIZATION N/A 01/16/2015   Procedure: Left Heart Cath and Coronary Angiography;  Surgeon: Burnell Blanks, MD;  Location: [facility];  Service: Cardiovascular;  Laterality: N/A;   CEREBRAL ANEURYSM REPAIR     CHOLECYSTECTOMY     ileostomyw/ takedown 8/09 Dr Crisoforo [facility]   COLON SURGERY     COLONOSCOPY  11/13/2007   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,  but on withdrawal of the scope, the anastomosis seemed to dilate  following the procedure/ Normal visualized colon with stool seen in the proximal colon   COLONOSCOPY  09/12/2008    Small rectal pouch, which made retroflexion challenging/ no polyps detected in the rectum/ No diverticula   COLONOSCOPY  01/21/2012   Procedure: COLONOSCOPY;  Surgeon: Danie Binder, MD;  [facility];  Service: Endoscopy;  Laterality: N/A;   COLONOSCOPY N/A 12/25/2017   Procedure: COLONOSCOPY;  Surgeon: Danie Binder, MD;  [facility];  Service: Endoscopy;  Laterality: N/A;  1:00pm   EP IMPLANTABLE DEVICE N/A 02/08/2015   Procedure: Pacemaker Implant;  Surgeon: Deboraha Sprang, MD;  Location: [facility];  Service: Cardiovascular;  Laterality: N/A;   SIGMOIDOSCOPY  12/24/2006   rectal adenocarcinoma/Normal retroflexed view of the rectum   VENTRAL HERNIA REPAIR        Social History:      Social History   Tobacco Use   Smoking status: Former Smoker    Packs/day: 1.50    Years: 8.00    Pack years: 12.00    Types: Cigarettes    Quit date: 01/06/2007    Years since quitting: 12.0   Smokeless tobacco: Never Used  Substance Use Topics   Alcohol use: Yes    Alcohol/week: 0.0 standard drinks    Comment: rarely       Family History :     Family History  Problem Relation Age of Onset   Heart disease Other    Lung disease Other    Diabetes Other    Colon cancer Neg Hx       Home Medications:   Prior to Admission medications   Medication Sig Start Date End Date Taking? Authorizing Provider  Carboxymethylcellulose Sodium (THERATEARS) 0.25 % SOLN Place 1 drop into both eyes 2 (two) times daily.   Yes [provider]  escitalopram (LEXAPRO) 10 MG tablet Take 10 mg by mouth at bedtime. 11/14/17  Yes [provider]  ezetimibe (ZETIA) 10 MG tablet Take 1 tablet (10 mg total) by mouth daily. Patient taking differently: Take 10 mg by mouth at  bedtime.  06/26/15  Yes Skeet Latch, MD  ibuprofen (ADVIL,MOTRIN) 200 MG tablet Take 400 mg by mouth every 8 (eight) hours as needed (for pain.).    Yes [provider]  OVER THE COUNTER MEDICATION TOTAL RESTORE for the gut 3 capsule daily   Yes [provider]  oxybutynin (DITROPAN-XL) 10 MG 24 hr tablet Take 10 mg by mouth daily.  02/04/17  Yes [provider]  psyllium (METAMUCIL) 58.6 % powder Take 1 packet by mouth daily.   Yes [provider]  traZODone (DESYREL) 100 MG tablet Take 100 mg by mouth at bedtime.  01/28/17  Yes [provider]  Na Sulfate-K Sulfate-Mg Sulf 17.5-3.13-1.6 GM/177ML SOLN Take 1 kit by mouth as directed. Patient not taking: Reported on 01/05/2019 11/19/17   Danie Binder, MD     Allergies:     Allergies  Allergen Reactions   Compazine Other (See Comments)    Reaction: seizures   Hydromorphone Hcl Other (See Comments)    REACTION: hallucinations and behavior issues   Suprep [Na Sulfate-K Sulfate-Mg Sulf]     Severe bloating and pain after first dose.      Physical Exam:   Vitals  Blood pressure (!) 160/80, pulse 65, temperature (!) 97.5 F (36.4 C), temperature source Oral, resp. rate 17, height 5' 4"  (1.626 m), weight 69.4 kg, SpO2 100 %.  1.  General: axoxo3  2. Psychiatric: euthymic  3. Neurologic: cn2-12 intact, reflexes 2+ symmetric, diffuse with no clonus, motor 5/5 in all 4ext Pt states that right side of face slightly dull to pinprik  4. HEENMT:  Anicteric, pupils 1.6m symmetric, direct, consensual, near Neck: no jvd, no bruit  5. Respiratory : CTAB  6. Cardiovascular : rrr s1, s2, no m/g/r  7. Gastrointestinal:  Abd: soft, nt, nd, +bs  8. Skin:  Ext: no c/c/e,  No rash  9.Musculoskeletal:  Good ROM  No adenopathy    Data Review:    CBC Recent Labs  Lab 01/05/19 1149  WBC 3.6*  HGB 12.1  HCT 37.9  PLT 106*  MCV 93.6  MCH 29.9  MCHC 31.9  RDW 13.9    LYMPHSABS 1.0  MONOABS 0.3  EOSABS 0.1  BASOSABS 0.0   ------------------------------------------------------------------------------------------------------------------  Results for orders placed or performed during the hospital encounter of 01/05/19 (from the past 48 hour(s))  Urine rapid drug screen (hosp performed)[facility]    Status: None   Collection Time: 01/05/19 11:46 AM  Result Value Ref Range   Opiates NONE DETECTED NONE DETECTED   Cocaine NONE DETECTED NONE DETECTED   Benzodiazepines NONE DETECTED NONE DETECTED   Amphetamines NONE DETECTED NONE DETECTED   Tetrahydrocannabinol NONE DETECTED NONE DETECTED   Barbiturates NONE DETECTED NONE DETECTED    Comment: (NOTE) DRUG SCREEN FOR MEDICAL PURPOSES ONLY.  IF CONFIRMATION IS NEEDED FOR ANY PURPOSE, NOTIFY LAB WITHIN 5 DAYS. LOWEST DETECTABLE LIMITS FOR URINE DRUG SCREEN Drug Class                     Cutoff (ng/mL) Amphetamine and metabolites    1000 Barbiturate and metabolites    200 Benzodiazepine                 2897Tricyclics and metabolites     300 Opiates and metabolites        300 Cocaine and metabolites        300 THC                            50 Performed at [facility] [address], [place] [address]  Urinalysis, Routine w reflex microscopic     Status: Abnormal   Collection Time: 01/05/19 11:46 AM  Result Value Ref Range   Color, Urine YELLOW YELLOW   APPearance CLEAR CLEAR   Specific Gravity, Urine 1.009 1.005 - 1.030   pH 6.0 5.0 - 8.0   Glucose, UA NEGATIVE NEGATIVE mg/dL  Hgb urine dipstick NEGATIVE NEGATIVE   Bilirubin Urine NEGATIVE NEGATIVE   Ketones, ur NEGATIVE NEGATIVE mg/dL   Protein, ur NEGATIVE NEGATIVE mg/dL   Nitrite NEGATIVE NEGATIVE   Leukocytes,Ua TRACE (A) NEGATIVE   RBC / HPF 0-5 0 - 5 RBC/hpf   WBC, UA 0-5 0 - 5 WBC/hpf   Bacteria, UA NONE SEEN NONE SEEN   Squamous Epithelial / LPF 0-5 0 - 5   Mucus PRESENT     Comment: Performed at [facility],  [address] [place], [place] [address]  CBC     Status: Abnormal   Collection Time: 01/05/19 11:49 AM  Result Value Ref Range   WBC 3.6 (L) 4.0 - 10.5 K/uL   RBC 4.05 3.87 - 5.11 MIL/uL   Hemoglobin 12.1 12.0 - 15.0 g/dL   HCT 37.9 36.0 - 46.0 %   MCV 93.6 80.0 - 100.0 fL   MCH 29.9 26.0 - 34.0 pg   MCHC 31.9 30.0 - 36.0 g/dL   RDW 13.9 11.5 - 15.5 %   Platelets 106 (L) 150 - 400 K/uL    Comment: PLATELET COUNT CONFIRMED BY SMEAR SPECIMEN CHECKED FOR CLOTS Immature Platelet Fraction may be clinically indicated, consider ordering this additional test [id number]    nRBC 0.0 0.0 - 0.2 %    Comment: Performed at [facility], [address], [place], [place] [address]  Troponin I (High Sensitivity)     Status: Abnormal   Collection Time: 01/05/19 11:49 AM  Result Value Ref Range   Troponin I (High Sensitivity) 29 (H) <18 ng/L    Comment: (NOTE) Elevated high sensitivity troponin I (hsTnI) values and significant  changes across serial measurements may suggest ACS but many other  chronic and acute conditions are known to elevate hsTnI results.  Refer to the "Links" section for chest pain algorithms and additional  guidance. Performed at [facility], [address] [place], [place] [address]   Ethanol     Status: None   Collection Time: 01/05/19 11:49 AM  Result Value Ref Range   Alcohol, Ethyl (B) <10 <10 mg/dL    Comment: (NOTE) Lowest detectable limit for serum alcohol is 10 mg/dL. For medical purposes only. Performed at [facility], [address], [place], [place] [address]   Protime-INR     Status: None   Collection Time: 01/05/19 11:49 AM  Result Value Ref Range   Prothrombin Time 13.4 11.4 - 15.2 seconds   INR 1.0 0.8 - 1.2    Comment: (NOTE) INR goal varies based on device and disease states. Performed at [facility], [address], [place], [place] [address]   APTT     Status: Abnormal   Collection Time: 01/05/19 11:49 AM  Result Value Ref Range   aPTT 38 (H) 24 -  36 seconds    Comment:        IF BASELINE aPTT IS ELEVATED, SUGGEST PATIENT RISK ASSESSMENT BE USED TO DETERMINE APPROPRIATE ANTICOAGULANT THERAPY. Performed at [facility], [address] [place], [place] [address]   Differential     Status: None   Collection Time: 01/05/19 11:49 AM  Result Value Ref Range   Neutrophils Relative % 64 %   Neutro Abs 2.4 1.7 - 7.7 K/uL   Lymphocytes Relative 26 %   Lymphs Abs 1.0 0.7 - 4.0 K/uL   Monocytes Relative 7 %   Monocytes Absolute 0.3 0.1 - 1.0 K/uL   Eosinophils Relative 2 %   Eosinophils Absolute 0.1 0.0 -  0.5 K/uL   Basophils Relative 1 %   Basophils Absolute 0.0 0.0 - 0.1 K/uL   Immature Granulocytes 0 %   Abs Immature Granulocytes 0.01 0.00 - 0.07 K/uL    Comment: Performed at [facility], [address], [place], [place] [address]  Comprehensive metabolic panel     Status: Abnormal   Collection Time: 01/05/19 11:49 AM  Result Value Ref Range   Sodium 139 135 - 145 mmol/L   Potassium 3.4 (L) 3.5 - 5.1 mmol/L   Chloride 102 98 - 111 mmol/L   CO2 27 22 - 32 mmol/L   Glucose, Bld 105 (H) 70 - 99 mg/dL   BUN 10 8 - 23 mg/dL   Creatinine, Ser 0.90 0.44 - 1.00 mg/dL   Calcium 9.3 8.9 - 10.3 mg/dL   Total Protein 6.9 6.5 - 8.1 g/dL   Albumin 3.9 3.5 - 5.0 g/dL   AST 20 15 - 41 U/L   ALT 19 0 - 44 U/L   Alkaline Phosphatase 63 38 - 126 U/L   Total Bilirubin 0.6 0.3 - 1.2 mg/dL   GFR calc non Af Amer >60 >60 mL/min   GFR calc Af Amer >60 >60 mL/min   Anion gap 10 5 - 15    Comment: Performed at [facility], [address] [place], [place] [address]  CBG monitoring, ED     Status: Abnormal   Collection Time: 01/05/19 12:09 PM  Result Value Ref Range   Glucose-Capillary 100 (H) 70 - 99 mg/dL    Chemistries  Recent Labs  Lab 01/05/19 1149  NA 139  K 3.4*  CL 102  CO2 27  GLUCOSE 105*  BUN 10  CREATININE 0.90  CALCIUM 9.3  AST 20  ALT 19  ALKPHOS 63  BILITOT 0.6    ------------------------------------------------------------------------------------------------------------------  ------------------------------------------------------------------------------------------------------------------ GFR: Estimated Creatinine Clearance: 62.8 mL/min (by C-G formula based on SCr of 0.9 mg/dL). Liver Function Tests: Recent Labs  Lab 01/05/19 1149  AST 20  ALT 19  ALKPHOS 63  BILITOT 0.6  PROT 6.9  ALBUMIN 3.9   No results for input(s): LIPASE, AMYLASE in the last 168 hours. No results for input(s): AMMONIA in the last 168 hours. Coagulation Profile: Recent Labs  Lab 01/05/19 1149  INR 1.0   Cardiac Enzymes: No results for input(s): CKTOTAL, CKMB, CKMBINDEX, TROPONINI in the last 168 hours. BNP (last 3 results) No results for input(s): PROBNP in the last 8760 hours. HbA1C: No results for input(s): HGBA1C in the last 72 hours. CBG: Recent Labs  Lab 01/05/19 1209  GLUCAP 100*   Lipid Profile: No results for input(s): CHOL, HDL, LDLCALC, TRIG, CHOLHDL, LDLDIRECT in the last 72 hours. Thyroid Function Tests: No results for input(s): TSH, T4TOTAL, FREET4, T3FREE, THYROIDAB in the last 72 hours. Anemia Panel: No results for input(s): VITAMINB12, FOLATE, FERRITIN, TIBC, IRON, RETICCTPCT in the last 72 hours.  --------------------------------------------------------------------------------------------------------------- Urine analysis:    Component Value Date/Time   COLORURINE YELLOW 01/05/2019 1146   APPEARANCEUR CLEAR 01/05/2019 1146   LABSPEC 1.009 01/05/2019 1146   PHURINE 6.0 01/05/2019 1146   GLUCOSEU NEGATIVE 01/05/2019 1146   HGBUR NEGATIVE 01/05/2019 1146   BILIRUBINUR NEGATIVE 01/05/2019 1146   KETONESUR NEGATIVE 01/05/2019 1146   PROTEINUR NEGATIVE 01/05/2019 1146   NITRITE NEGATIVE 01/05/2019 1146   LEUKOCYTESUR TRACE (A) 01/05/2019 1146      Imaging Results:    Dg Chest Portable 1 View  Result Date:  01/05/2019 CLINICAL DATA:  February 20, 2017 EXAM:  PORTABLE CHEST 1 VIEW COMPARISON:  February 20, 2017 FINDINGS: No edema or consolidation. Heart is upper normal in size with pulmonary vascularity normal. Pacemaker leads are attached to the right atrium and right ventricle. There is aortic atherosclerosis. No adenopathy. No bone lesions. IMPRESSION: No acute cardiopulmonary finding. Pacemaker leads attached to right atrium and right ventricle. Heart upper normal in size. No edema or consolidation. Aortic Atherosclerosis (ICD10-I70.0). Electronically Signed   By: Lowella Grip III M.D.   On: 01/05/2019 11:32   Ct Angio Chest/abd/pel For Dissection W And/or Wo Contrast  Result Date: 01/05/2019 CLINICAL DATA:  Chest pain, question of aortic dissection mid upper back pain for 1 and half week Radian thick chest EXAM: CT ANGIOGRAPHY CHEST, ABDOMEN AND PELVIS TECHNIQUE: Multidetector CT imaging through the chest, abdomen and pelvis was performed using the standard protocol during bolus administration of intravenous contrast. Multiplanar reconstructed images and MIPs were obtained and reviewed to evaluate the vascular anatomy. CONTRAST:  185m OMNIPAQUE IOHEXOL 350 MG/ML SOLN COMPARISON:  None. FINDINGS: CTA CHEST FINDINGS Cardiovascular: --Heart: The heart size is mildly enlarged. There is nopericardial effusion. --Aorta: The course and caliber of the thoracic aorta are normal. There is mild atherosclerotic calcifications seen at the aortic arch and descending intrathoracic aorta. Precontrast images show no aortic intramural hematoma. There is no blood pool, dissection or penetrating ulcer demonstrated on arterial phase postcontrast imaging. There is a conventional 3 vessel aortic arch branching pattern. There is a mild amount of calcification seen at the origin of the great vessels. --Pulmonary Arteries: Contrast timing is optimized for preferential opacification of the aorta. Within that limitation, normal  central pulmonary arteries. Mediastinum/Nodes: No mediastinal, hilar or axillary lymphadenopathy. The visualized thyroid and thoracic esophageal course are unremarkable. Lungs/Pleura: No pulmonary nodules or masses. No pleural effusion or pneumothorax. No focal airspace consolidation. No focal pleural abnormality. Musculoskeletal: A left-sided pacemaker is seen with lead tips in the right atrium and right ventricle. No chest wall abnormality. No acute osseous findings. Review of the MIP images confirms the above findings. CTA ABDOMEN AND PELVIS FINDINGS VASCULAR Aorta: Normal caliber aorta without aneurysm, dissection, vasculitis or hemodynamically significant stenosis. There is calcified atherosclerosis and noncalcified thrombus seen within the intra-abdominal aorta predominantly within the infrarenal portion. The maximum diameter of the infrarenal portion of the abdominal aorta is 1.7 cm. Celiac: No aneurysm, dissection or hemodynamically significant stenosis. Normal branching pattern. SMA: Widely patent without dissection or stenosis. Renals: Single renal arteries bilaterally. Atherosclerosis seen at the origins of the single bilateral renal arteries without significant stenosis. No aneurysm, dissection, stenosis or evidence of fibromuscular dysplasia. IMA: Patent without abnormality. Inflow: No aneurysm, stenosis or dissection. Review of the MIP images confirms the above findings. NON-VASCULAR Hepatobiliary: Normal hepatic contours and density. No visible biliary dilatation. The patient is status post cholecystectomy. No biliary ductal dilation. Pancreas: Normal contours without ductal dilatation. No peripancreatic fluid collection. Spleen: Normal arterial phase splenic enhancement pattern. Adrenals/Urinary Tract: --Adrenal glands: Normal. --Right kidney/ureter: There is a tube cm low-density lesion seen within the upper pole of the right kidney. No hydronephrosis is seen. --Left kidney/ureter: No hydronephrosis  or perinephric stranding. No nephrolithiasis. No obstructing ureteral stones. --Urinary bladder: Unremarkable. Stomach/Bowel: --Stomach/Duodenum: No hiatal hernia or other gastric abnormality. Normal duodenal course and caliber. --Small bowel: No dilatation or inflammation. --Colon: There is surgical anastomosis seen at the sigmoid rectal junction. Surrounding mild perirectal thickening is noted, likely postsurgical changes. There is a decompressed sigmoid colon with apparent bowel wall thickening. --Appendix: Not  seen. Lymphatic:  No abdominal or pelvic lymphadenopathy. Reproductive: No free fluid in the pelvis. The patient is status post hysterectomy. Musculoskeletal. No bony spinal canal stenosis or focal osseous abnormality. Other: Abdominal wall mesh hernia is seen. There is a upper small anterior abdominal wall fat containing hernia measuring 3.9 cm in length. Review of the MIP images confirms the above findings. IMPRESSION: 1. No acute intrathoracic aortic abnormality. Mild amount of atherosclerotic plaque. 2. No central or segmental pulmonary embolism. 3. Calcified atherosclerosis and noncalcified thrombus seen within the infrarenal abdominal aorta without evidence of aneurysmal dilatation or other acute abnormality. Patent celiac, SMA, and IMA. 4. Status post partial colectomy with the anastomosis seen in the rectum. Mild perirectal soft tissue thickening within adjacent apparent sigmoid colonic wall thickening. This could be due to postsurgical/post radiation changes. If further evaluation is required would recommend MRI pelvis or correlation with prior exam. 5. Anterior abdominal wall fat containing hernia. Electronically Signed   By: Prudencio Pair M.D.   On: 01/05/2019 15:01   Ct Head Code Stroke Wo Contrast  Result Date: 01/05/2019 CLINICAL DATA:  Code stroke. Right facial numbness beginning 2 hours ago. Personal history of intracranial aneurysm treated in 1998. EXAM: CT HEAD WITHOUT CONTRAST  TECHNIQUE: Contiguous axial images were obtained from the base of the skull through the vertex without intravenous contrast. COMPARISON:  CT head without contrast 01/30/2015 and 11/07/2011 FINDINGS: Brain: Patient is status post right pterional craniotomy. There is chronic encephalomalacia involving the right frontal lobe. Aneurysm clips are stable in position. No acute infarct, hemorrhage, or mass lesion is present. Basal ganglia are within normal limits. Insular ribbon is normal bilaterally. No acute or focal cortical abnormalities are present. No significant white matter lesions are present. The ventricles are of normal size. No significant extraaxial fluid collection is present. Vascular: Right ICA aneurysm clip is in place. Minimal atherosclerotic calcifications are present within the cavernous internal carotid arteries without significant interval change. There is no hyperdense vessel. Skull: Right pterional craniotomy. Calvarium is otherwise intact. No focal lytic or blastic lesions are present. No significant extracranial soft tissue injury is present. Sinuses/Orbits: The paranasal sinuses and mastoid air cells are clear. The globes and orbits are within normal limits. ASPECTS [facility] Stroke Program Early CT Score) - Ganglionic level infarction (caudate, lentiform nuclei, internal capsule, insula, M1-M3 cortex): 7/7 - Supraganglionic infarction (M4-M6 cortex): 3/3 Total score (0-10 with 10 being normal): 10/10 IMPRESSION: 1. No acute intracranial abnormality or significant interval change 2. 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 3. Right ICA aneurysm clipping is stable 4. ASPECTS is 10/10 These results were called by telephone at the time of interpretation on 01/05/2019 at 12:01 pm to Dr. Francine Graven , who verbally acknowledged these results. Electronically Signed   By: San Morelle M.D.   On: 01/05/2019 12:01    ekg paced at 70   Assessment & Plan:    Principal  Problem:   Chest pain Active Problems:   Right facial numbness   Chest pain  Tele Trop I q2h x2 Check cardiac echo  Cardiology consult placed in computer  R facial numbness No MRI brain due to pacer Check carotid ultrasound Check cardiac echo Check hga1c, lipid Aspirin  Pravastatin 70m po qhs Cont Zetia 136mpo qday Neurology consult placed in coputer  Elevated troponin Check cardiac echo as above  Anxiety Cont Lexapro 1030mo qday Cont Trazodone 100m56m qhs  DVT Prophylaxis-  - SCDs  AM Labs Ordered, also  please review Full Orders  Family Communication: Admission, patients condition and plan of care including tests being ordered have been discussed with the patient  who indicate understanding and agree with the plan and Code Status.  Code Status:  FULL  CODE,   Admission status: Observation: Based on patients clinical presentation and evaluation of above clinical data, I have made determination that patient meets observation criteria at this time.  Time spent in minutes : 70   Jani Gravel M.D on 01/05/2019 at 3:48 PM

## 2019-01-05 NOTE — ED Notes (Signed)
Refuses Zofran at this time.

## 2019-01-05 NOTE — Consult Note (Signed)
[facility] Consult Services   Date of Service:   01/05/2019 11:51:02  Impression:     .  Small Vessel Infarct  Comments/Sign-Out: Isolated sensory loss on the right suggest either a thalamic stroke or a right parietal cortical stroke. She has multiple vascular risk factors.  Mechanism of Stroke: Not Clear  Metrics: Last Known Well: 01/05/2019 10:00:00 [facility] Notification Time: 01/05/2019 11:51:02 Arrival Time: 01/05/2019 10:56:00 Stamp Time: 01/05/2019 11:51:02 Time First Login Attempt: 01/05/2019 11:56:31 Video Start Time: 01/05/2019 11:56:31  Symptoms: Right facial numbness NIHSS Start Assessment Time: 01/05/2019 12:01:00 Patient is not a candidate for tPA. 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 Video End Time: 01/05/2019 12:10:38  CT head showed no acute hemorrhage or acute core infarct. CT head was reviewed and results were: There is an aneurysm clip at the skull base on the right and an area of encephalomalacia by a craniotomy site in the right frontal region. There is no evidence of acute ischemic change, hemorrhage, or mass lesion.  Clinical Presentation is not Suggestive of Large Vessel Occlusive Disease  Radiologist was not called back for review of advanced imaging because Not indicated ED Physician notified of diagnostic impression and management plan on 01/05/2019 12:09:00  Our recommendations are outlined below.  Recommendations:     .  Activate Stroke Protocol Admission/Order Set     .  Stroke/Telemetry Floor     .  Neuro Checks     .  Bedside Swallow Eval     .  DVT Prophylaxis     .  IV Fluids, Normal Saline     .  Head of Bed 30 Degrees     .  Euglycemia and Avoid Hyperthermia (PRN Acetaminophen)     .  Antiplatelet Therapy Recommended     .  Cannot have MRI. Needs carotid ultrasound, echocardiogram, A1c, fasting  lipid profile, electrocardiographic monitoring.  Routine Consultation with [facility] for Follow up Care  Sign Out:     .  Discussed with Emergency Department Provider    ------------------------------------------------------------------------------  History of Present Illness: Patient is a 63 year old Female.  Patient was brought by private transportation with symptoms of Right facial numbness  This is a 63 year old woman who came to the hospital chest pain but at approximately 1000 she noted numbness of the right face. She had no facial droop. She had a difficulty with speech or weakness of the extremities. She had no vision loss. She has no history of hypertension or diabetes. She has a pacemaker for bradycardia. She has hyperlipidemia. 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 She has thrombocytopenia following chemotherapy 2008 rectal cancer. She is an ex-smoker. She is not on antiplatelet or anticoagulant medication.  There is history of hemorrhagic complications or intracranial hemorrhage. There is no history of recent major surgery. There is no history of recent stroke.  Examination: BP(147/81), Pulse(54, paced full-time.), Blood Glucose(Not done yet) 1A: Level of Consciousness - Alert; keenly responsive + 0 1B: Ask Month and Age - Both Questions Right + 0 1C: Blink Eyes & Squeeze Hands - Performs Both Tasks + 0 2: Test Horizontal Extraocular Movements - Normal + 0 3: Test Visual Fields - No Visual Loss + 0 4: Test Facial Palsy (Use Grimace if Obtunded) - Normal symmetry + 0 5A: Test Left Arm Motor Drift - No Drift for 10 Seconds + 0 5B: Test Right Arm  Motor Drift - No Drift for 10 Seconds + 0 6A: Test Left Leg Motor Drift - No Drift for 5 Seconds + 0 6B: Test Right Leg Motor Drift - No Drift for 5 Seconds + 0 7: Test Limb Ataxia (FNF/Heel-Shin) - No Ataxia + 0 8: Test Sensation - Mild-Moderate Loss: Less Sharp/More Dull + 1 9:  Test Language/Aphasia - Normal; No aphasia + 0 10: Test Dysarthria - Normal + 0 11: Test Extinction/Inattention - No abnormality + 0  NIHSS Score: 1  Patient/Family was informed the Neurology Consult would happen via TeleHealth consult by way of interactive audio and video telecommunications and consented to receiving care in this manner.  Due to the immediate potential for life-threatening deterioration due to underlying acute neurologic illness, I spent 35 minutes providing critical care. This time includes time for face to face visit via telemedicine, review of medical records, imaging studies and discussion of findings with providers, the patient and/or family.   Dr Cindie Laroche   [facility] [phone]  Case [id number]

## 2019-01-05 NOTE — ED Triage Notes (Signed)
Pt with mid upper back pain for 1.5 weeks but today radiated to chest, + sob, denies N/V

## 2019-01-05 NOTE — ED Notes (Signed)
Dr Harl Bowie at bedside.  U/S in for testing.

## 2019-01-05 NOTE — ED Notes (Signed)
Dr. Harl Bowie (Cardiology) in the dept to see the Pt.

## 2019-01-05 NOTE — Progress Notes (Signed)
CODE STROKE CT TIMES 1139 CALL TIME 1140 BEEPER TIME 1145 EXAM STARTED 1146 EXAM FINISHED 1146 IMAGES SENT TO SOC 1149 EXAM COMPLETED IN EPIC 1149 [facility] CALLED.

## 2019-01-05 NOTE — ED Notes (Signed)
During pts assessment for chest pain.  Pt reports having right sided face numbness and 10 am while at work.  Report SOB at 1000 am with sharp pain to right upper back shooting to front of chest.

## 2019-01-06 ENCOUNTER — Observation Stay (HOSPITAL_BASED_OUTPATIENT_CLINIC_OR_DEPARTMENT_OTHER): Payer: 59

## 2019-01-06 ENCOUNTER — Other Ambulatory Visit (HOSPITAL_COMMUNITY): Payer: Self-pay | Admitting: *Deleted

## 2019-01-06 DIAGNOSIS — R079 Chest pain, unspecified: Secondary | ICD-10-CM | POA: Diagnosis not present

## 2019-01-06 DIAGNOSIS — R200 Anesthesia of skin: Secondary | ICD-10-CM

## 2019-01-06 DIAGNOSIS — I361 Nonrheumatic tricuspid (valve) insufficiency: Secondary | ICD-10-CM

## 2019-01-06 DIAGNOSIS — Z950 Presence of cardiac pacemaker: Secondary | ICD-10-CM | POA: Diagnosis not present

## 2019-01-06 DIAGNOSIS — I340 Nonrheumatic mitral (valve) insufficiency: Secondary | ICD-10-CM

## 2019-01-06 DIAGNOSIS — I495 Sick sinus syndrome: Secondary | ICD-10-CM

## 2019-01-06 DIAGNOSIS — I7410 Embolism and thrombosis of unspecified parts of aorta: Secondary | ICD-10-CM

## 2019-01-06 LAB — HIV ANTIBODY (ROUTINE TESTING W REFLEX): HIV Screen 4th Generation wRfx: NONREACTIVE

## 2019-01-06 LAB — LIPID PANEL
Cholesterol: 193 mg/dL (ref 0–200)
HDL: 33 mg/dL — ABNORMAL LOW (ref >40)
LDL Cholesterol: 130 mg/dL — ABNORMAL HIGH (ref 0–99)
Total CHOL/HDL Ratio: 5.8 RATIO
Triglycerides: 151 mg/dL — ABNORMAL HIGH (ref <150)
VLDL: 30 mg/dL (ref 0–40)

## 2019-01-06 LAB — ECHOCARDIOGRAM COMPLETE
Height: 64 in
Weight: 2448 oz

## 2019-01-06 LAB — HEMOGLOBIN A1C
Hgb A1c MFr Bld: 5.4 % (ref 4.8–5.6)
Mean Plasma Glucose: 108.28 mg/dL

## 2019-01-06 MED ORDER — ASPIRIN EC 325 MG PO TBEC: 325.0000 mg | DELAYED_RELEASE_TABLET | Freq: Every day | ORAL | Status: DC

## 2019-01-06 MED ORDER — PRAVASTATIN SODIUM 40 MG PO TABS: 40.0000 mg | ORAL_TABLET | Freq: Every day | ORAL | 0 refills | Status: DC

## 2019-01-06 MED ORDER — POLYVINYL ALCOHOL 1.4 % OP SOLN
1.0000 [drp] | OPHTHALMIC | Status: DC | PRN
  Filled 2019-01-06: qty 15

## 2019-01-06 MED ORDER — ASPIRIN 300 MG RE SUPP: 300.0000 mg | Freq: Every day | RECTAL | Status: DC

## 2019-01-06 MED ORDER — ASPIRIN EC 81 MG PO TBEC: 81.0000 mg | DELAYED_RELEASE_TABLET | Freq: Every day | ORAL | Status: DC

## 2019-01-06 MED ORDER — EZETIMIBE 10 MG PO TABS: 10.0000 mg | ORAL_TABLET | Freq: Every day | ORAL | Status: DC

## 2019-01-06 NOTE — Progress Notes (Signed)
*  PRELIMINARY RESULTS* Echocardiogram 2D Echocardiogram has been performed.  Robin Wolfe 01/06/2019, 10:04 AM

## 2019-01-06 NOTE — Discharge Instructions (Signed)
Nonspecific Chest Pain, Adult Chest pain can be caused by many different conditions. It can be caused by a condition that is life-threatening and requires treatment right away. It can also be caused by something that is not life-threatening. If you have chest pain, it can be hard to know the difference, so it is important to get help right away to make sure that you do not have a serious condition. Some life-threatening causes of chest pain include:  Heart attack.  A tear in the body's main blood vessel (aortic dissection).  Inflammation around your heart (pericarditis).  A problem in the lungs, such as a blood clot (pulmonary embolism) or a collapsed lung (pneumothorax). Some non life-threatening causes of chest pain include:  Heartburn.  Anxiety or stress.  Damage to the bones, muscles, and cartilage that make up your chest wall.  Pneumonia or bronchitis.  Shingles infection (varicella-zoster virus). Chest pain can feel like:  Pain or discomfort on the surface of your chest or deep in your chest.  Crushing, pressure, aching, or squeezing pain.  Burning or tingling.  Dull or sharp pain that is worse when you move, cough, or take a deep breath.  Pain or discomfort that is also felt in your back, neck, jaw, shoulder, or arm, or pain that spreads to any of these areas. Your chest pain may come and go. It may also be constant. Your health care provider will do lab tests and other studies to find the cause of your pain. Treatment will depend on the cause of your chest pain. Follow these instructions at home: Medicines  Take over-the-counter and prescription medicines only as told by your health care provider.  If you were prescribed an antibiotic, take it as told by your health care provider. Do not stop taking the antibiotic even if you start to feel better. Lifestyle   Rest as directed by your health care provider.  Do not use any products that contain nicotine or tobacco,  such as cigarettes and e-cigarettes. If you need help quitting, ask your health care provider.  Do not drink alcohol.  Make healthy lifestyle choices as recommended. These may include: ? Getting regular exercise. Ask your health care provider to suggest some activities that are safe for you. ? Eating a heart-healthy diet. This includes plenty of fresh fruits and vegetables, whole grains, low-fat (lean) protein, and low-fat dairy products. A dietitian can help you find healthy eating options. ? Maintaining a healthy weight. ? Managing any other health conditions you have, such as high blood pressure (hypertension) or diabetes. ? Reducing stress, such as with yoga or relaxation techniques. General instructions  Pay attention to any changes in your symptoms. Tell your health care provider about them or any new symptoms.  Avoid any activities that cause chest pain.  Keep all follow-up visits as told by your health care provider. This is important. This includes visits for any further testing if your chest pain does not go away. Contact a health care provider if:  Your chest pain does not go away.  You feel depressed.  You have a fever. Get help right away if:  Your chest pain gets worse.  You have a cough that gets worse, or you cough up blood.  You have severe pain in your abdomen.  You faint.  You have sudden, unexplained chest discomfort.  You have sudden, unexplained discomfort in your arms, back, neck, or jaw.  You have shortness of breath at any time.  You suddenly start  to sweat, or your skin gets clammy.  You feel nausea or you vomit.  You suddenly feel lightheaded or dizzy.  You have severe weakness, or unexplained weakness or fatigue.  Your heart begins to beat quickly, or it feels like it is skipping beats. These symptoms may represent a serious problem that is an emergency. Do not wait to see if the symptoms will go away. Get medical help right away. Call your  local emergency services (911 in the [place] Do not drive yourself to the hospital. Summary  Chest pain can be caused by a condition that is serious and requires urgent treatment. It may also be caused by something that is not life-threatening.  If you have chest pain, it is very important to see your health care provider. Your health care provider may do lab tests and other studies to find the cause of your pain.  Follow your health care provider's instructions on taking medicines, making lifestyle changes, and getting emergency treatment if symptoms become worse.  Keep all follow-up visits as told by your health care provider. This includes visits for any further testing if your chest pain does not go away. This information is not intended to replace advice given to you by your health care provider. Make sure you discuss any questions you have with your health care provider. Document Released: 02/27/2005 Document Revised: 11/20/2017 Document Reviewed: 11/20/2017 Elsevier Patient Education  2020 [place].  IMPORTANT INFORMATION: PAY CLOSE ATTENTION   PHYSICIAN DISCHARGE INSTRUCTIONS  Follow with Primary care provider  Celene Squibb, MD  and other consultants as instructed by your Hospitalist Physician  [place] IF SYMPTOMS COME BACK, WORSEN OR NEW PROBLEM DEVELOPS   Please note: You were cared for by a hospitalist during your hospital stay. Every effort will be made to forward records to your primary care provider.  You can request that your primary care provider send for your hospital records if they have not received them.  Once you are discharged, your primary care physician will handle any further medical issues. Please note that NO REFILLS for any discharge medications will be authorized once you are discharged, as it is imperative that you return to your primary care physician (or establish a relationship with a primary care physician if you do  not have one) for your post hospital discharge needs so that they can reassess your need for medications and monitor your lab values.  Please get a complete blood count and chemistry panel checked by your Primary MD at your next visit, and again as instructed by your Primary MD.  Get Medicines reviewed and adjusted: Please take all your medications with you for your next visit with your Primary MD  Laboratory/radiological data: Please request your Primary MD to go over all hospital tests and procedure/radiological results at the follow up, please ask your primary care provider to get all Hospital records sent to his/her office.  In some cases, they will be blood work, cultures and biopsy results pending at the time of your discharge. Please request that your primary care provider follow up on these results.  If you are diabetic, please bring your blood sugar readings with you to your follow up appointment with primary care.    Please call and make your follow up appointments as soon as possible.    Also Note the following: If you experience worsening of your admission symptoms, develop shortness of breath, life threatening emergency, suicidal or homicidal thoughts  you must seek medical attention immediately by calling 911 or calling your MD immediately  if symptoms less severe.  You must read complete instructions/literature along with all the possible adverse reactions/side effects for all the Medicines you take and that have been prescribed to you. Take any new Medicines after you have completely understood and accpet all the possible adverse reactions/side effects.   Do not drive when taking Pain medications or sleeping medications (Benzodiazepines)  Do not take more than prescribed Pain, Sleep and Anxiety Medications. It is not advisable to combine anxiety,sleep and pain medications without talking with your primary care practitioner  Special Instructions: If you have smoked or chewed  Tobacco  in the last 2 yrs please stop smoking, stop any regular Alcohol  and or any Recreational drug use.  Wear Seat belts while driving.  Do not drive if taking any narcotic, mind altering or controlled substances or recreational drugs or alcohol.

## 2019-01-06 NOTE — Discharge Summary (Signed)
Physician Discharge Summary  Robin Wolfe KVQ:[id number] DOB: Dec 12, 1955 DOA: 01/05/2019  PCP: Celene Squibb, MD  Admit date: 01/05/2019 Discharge date: 01/06/2019  Admitted From: Home  Disposition: Home   Recommendations for Outpatient Follow-up:  1. Follow up with PCP in 1 weeks 2. Establish care with neurologist in 1 week 3. Follow up with cardiologist as scheduled  Discharge Condition: STABLE   CODE STATUS: FULL    Brief Hospitalization Summary: Please see all hospital notes, images, labs for full details of the hospitalization. Robin Wolfe  is a 63 y.o. female, w h/o rectal adenocarcinoma s/p chemo/ xrt, hyperlipidemia, anxiety, h/o bradycardia s/p pacer, CAD s/p  cardiac catheterization 01/16/2015-> prox LAD 30%, 1st diag 30%  , c/o chest pain starting this morning at rest.  Pt states pain "sharp" right sided and radiating to the back. Pt notes has had intermittent pain x 1 week.  Pt denies fever, chills, cough, palp, n/v, abd pain, diarrhea, brbpr, black stool.  Pt then states that she had right sided facial numbness about 2 hours prior to coming to ED.    In ED,  T 97.5,  P 69  R 20  Pox 100% on RA Wt 69.4 kg  CT brain IMPRESSION: 1. No acute intracranial abnormality or significant interval change 2. 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 3. Right ICA aneurysm clipping is stable 4. ASPECTS is 10/10  CTA chest/ abd/ pelvis IMPRESSION: 1. No acute intrathoracic aortic abnormality. Mild amount of atherosclerotic plaque. 2. No central or segmental pulmonary embolism. 3. Calcified atherosclerosis and noncalcified thrombus seen within the infrarenal abdominal aorta without evidence of aneurysmal dilatation or other acute abnormality. Patent celiac, SMA, and IMA. 4. Status post partial colectomy with the anastomosis seen in the rectum. Mild perirectal soft tissue thickening within adjacent apparent sigmoid colonic wall thickening. This could be due  to postsurgical/post radiation changes. If further evaluation is required would recommend MRI pelvis or correlation with prior exam. 5. Anterior abdominal wall fat containing hernia.  CXR IMPRESSION: No acute cardiopulmonary finding.  Pacemaker leads attached to right atrium and right ventricle. Heart upper normal in size. No edema or consolidation. Aortic Atherosclerosis (ICD10-I70.0).  Urinalysis negative  Wbc 3.6, Hgb 12.1, Plt 106 Na 139, K 3.4, Bun 10, Creatinine 0.90  PTT 38  Trop 29 -> 32  Corona virus negative  Pt will be admitted for w/up of chest pain as well as right facial numbness.   The patient was admitted for observationThe patient was admitted for observation and serial troponin testing.  The patient also had a cardiology consultation.  Her symptoms resolved.  Her right facial numbness resolved in addition to her chest pain.  Her symptoms were atypical.  She was unable to get an MRI due to pacemaker.  The patient symptoms resolved completely.  She had an echocardiogram noted below but no worrisome findings.  The patient is discharging home with outpatient follow-up.  She has been started on pravastatin for an LDL of 130.  Continue Zetia.  The patient has been started on aspirin 81 mg daily.  Outpatient follow-up with neurology recommended.  She was given information regarding stroke prevention and stroke warning signs.  Discharge Diagnoses:  Principal Problem:   Chest pain Active Problems:   Right facial numbness   Discharge Instructions: Discharge Instructions    Call MD for:  difficulty breathing, headache or visual disturbances   Complete by: As directed    Call MD for:  extreme fatigue  Complete by: As directed    Call MD for:  persistant dizziness or light-headedness   Complete by: As directed    Call MD for:  persistant nausea and vomiting   Complete by: As directed    Call MD for:  severe uncontrolled pain   Complete by: As directed     Diet - low sodium heart healthy   Complete by: As directed    Increase activity slowly   Complete by: As directed      Allergies as of 01/06/2019      Reactions   Compazine Other (See Comments)   Reaction: seizures   Hydromorphone Hcl Other (See Comments)   REACTION: hallucinations and behavior issues   Suprep [na Sulfate-k Sulfate-mg Sulf]    Severe bloating and pain after first dose.       Medication List    STOP taking these medications   Na Sulfate-K Sulfate-Mg Sulf 17.5-3.13-1.6 GM/177ML Soln     TAKE these medications   aspirin EC 81 MG tablet Take 1 tablet (81 mg total) by mouth daily.   escitalopram 10 MG tablet Commonly known as: LEXAPRO Take 10 mg by mouth at bedtime.   ezetimibe 10 MG tablet Commonly known as: ZETIA Take 1 tablet (10 mg total) by mouth at bedtime.   ibuprofen 200 MG tablet Commonly known as: ADVIL Take 400 mg by mouth every 8 (eight) hours as needed (for pain.).   OVER THE COUNTER MEDICATION TOTAL RESTORE for the gut 3 capsule daily   oxybutynin 10 MG 24 hr tablet Commonly known as: DITROPAN-XL Take 10 mg by mouth daily.   pravastatin 40 MG tablet Commonly known as: PRAVACHOL Take 1 tablet (40 mg total) by mouth daily at 6 PM.   psyllium 58.6 % powder Commonly known as: METAMUCIL Take 1 packet by mouth daily.   Theratears 0.25 % Soln Generic drug: Carboxymethylcellulose Sodium Place 1 drop into both eyes 2 (two) times daily.   traZODone 100 MG tablet Commonly known as: DESYREL Take 100 mg by mouth at bedtime.      Follow-up Information    Celene Squibb, MD. Schedule an appointment as soon as possible for a visit in 1 week(s).   Specialty: Internal Medicine Contact information: [place] [employer] [place] [address]        Skeet Latch, MD .   Specialty: Cardiology Contact information: [address] [place] [address] [phone]        Phillips Odor, MD. Schedule an appointment as soon as possible for a  visit in 1 week(s).   Specialty: Neurology Why: follow up facial numbness Contact information: [address] [place] [address] [phone]          Allergies  Allergen Reactions  . Compazine Other (See Comments)    Reaction: seizures  . Hydromorphone Hcl Other (See Comments)    REACTION: hallucinations and behavior issues  . Suprep [Na Sulfate-K Sulfate-Mg Sulf]     Severe bloating and pain after first dose.    Allergies as of 01/06/2019      Reactions   Compazine Other (See Comments)   Reaction: seizures   Hydromorphone Hcl Other (See Comments)   REACTION: hallucinations and behavior issues   Suprep [na Sulfate-k Sulfate-mg Sulf]    Severe bloating and pain after first dose.       Medication List    STOP taking these medications   Na Sulfate-K Sulfate-Mg Sulf 17.5-3.13-1.6 GM/177ML Soln     TAKE these medications  aspirin EC 81 MG tablet Take 1 tablet (81 mg total) by mouth daily.   escitalopram 10 MG tablet Commonly known as: LEXAPRO Take 10 mg by mouth at bedtime.   ezetimibe 10 MG tablet Commonly known as: ZETIA Take 1 tablet (10 mg total) by mouth at bedtime.   ibuprofen 200 MG tablet Commonly known as: ADVIL Take 400 mg by mouth every 8 (eight) hours as needed (for pain.).   OVER THE COUNTER MEDICATION TOTAL RESTORE for the gut 3 capsule daily   oxybutynin 10 MG 24 hr tablet Commonly known as: DITROPAN-XL Take 10 mg by mouth daily.   pravastatin 40 MG tablet Commonly known as: PRAVACHOL Take 1 tablet (40 mg total) by mouth daily at 6 PM.   psyllium 58.6 % powder Commonly known as: METAMUCIL Take 1 packet by mouth daily.   Theratears 0.25 % Soln Generic drug: Carboxymethylcellulose Sodium Place 1 drop into both eyes 2 (two) times daily.   traZODone 100 MG tablet Commonly known as: DESYREL Take 100 mg by mouth at bedtime.       Procedures/Studies: Echocardiogram   1. The left ventricle has normal systolic function with  an ejection fraction of 60-65%. The cavity size was normal. Left ventricular diastolic Doppler parameters are consistent with impaired relaxation. No evidence of left ventricular regional wall  motion abnormalities.  2. The aortic valve is tricuspid. Mild to moderate aortic annular calcification noted.  3. The mitral valve is grossly normal. Mild thickening of the mitral valve leaflet.  4. The tricuspid valve is grossly normal. Tricuspid valve regurgitation is mild-moderate.  5. The aorta is normal in size and structure.    US Carotid Bilateral (at [facility] Only)  Result Date: 01/06/2019 CLINICAL DATA:  63 year old female with a history of TIA EXAM: BILATERAL CAROTID DUPLEX ULTRASOUND TECHNIQUE: Pearline Cables scale imaging, color Doppler and duplex ultrasound were performed of bilateral carotid and vertebral arteries in the neck. COMPARISON:  None. FINDINGS: Criteria: Quantification of carotid stenosis is based on velocity parameters that correlate the residual internal carotid diameter with NASCET-based stenosis levels, using the diameter of the distal internal carotid lumen as the denominator for stenosis measurement. The following velocity measurements were obtained: RIGHT ICA:  Systolic 98 cm/sec, Diastolic 37 cm/sec CCA:  68 cm/sec SYSTOLIC ICA/CCA RATIO:  1.4 ECA:  59 cm/sec LEFT ICA:  Systolic 841 cm/sec, Diastolic 47 cm/sec CCA:  79 cm/sec SYSTOLIC ICA/CCA RATIO:  1.5 ECA:  65 cm/sec Right Brachial SBP: Not acquired Left Brachial SBP: Not acquired RIGHT CAROTID ARTERY: No significant calcified disease of the right common carotid artery. Intermediate waveform maintained. Heterogeneous plaque without significant calcifications at the right carotid bifurcation. Low resistance waveform of the right ICA. No significant tortuosity. RIGHT VERTEBRAL ARTERY: Antegrade flow with low resistance waveform. LEFT CAROTID ARTERY: No significant calcified disease of the left common carotid artery. Intermediate waveform  maintained. Heterogeneous plaque at the left carotid bifurcation without significant calcifications. Low resistance waveform of the left ICA. LEFT VERTEBRAL ARTERY: Antegrade flow, with late reversal of systole and then antegrade flow in diastole. IMPRESSION: Color duplex indicates moderate heterogeneous plaque with no hemodynamically significant stenosis by duplex criteria in the extracranial cerebrovascular circulation. 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 Signed, Dulcy Fanny. Dellia Nims, RPVI [facility] Electronically Signed   By: Corrie Mckusick D.O.   On: 01/06/2019 07:28   Dg Chest Portable 1 View  Result Date:  01/05/2019 CLINICAL DATA:  February 20, 2017 EXAM: PORTABLE CHEST 1 VIEW COMPARISON:  February 20, 2017 FINDINGS: No edema or consolidation. Heart is upper normal in size with pulmonary vascularity normal. Pacemaker leads are attached to the right atrium and right ventricle. There is aortic atherosclerosis. No adenopathy. No bone lesions. IMPRESSION: No acute cardiopulmonary finding. Pacemaker leads attached to right atrium and right ventricle. Heart upper normal in size. No edema or consolidation. Aortic Atherosclerosis (ICD10-I70.0). Electronically Signed   By: Lowella Grip III M.D.   On: 01/05/2019 11:32   Ct Angio Chest/abd/pel For Dissection W And/or Wo Contrast  Result Date: 01/05/2019 CLINICAL DATA:  Chest pain, question of aortic dissection mid upper back pain for 1 and half week Radian thick chest EXAM: CT ANGIOGRAPHY CHEST, ABDOMEN AND PELVIS TECHNIQUE: Multidetector CT imaging through the chest, abdomen and pelvis was performed using the standard protocol during bolus administration of intravenous contrast. Multiplanar reconstructed images and MIPs were obtained and reviewed to evaluate the vascular anatomy. CONTRAST:  136mL OMNIPAQUE IOHEXOL  350 MG/ML SOLN COMPARISON:  None. FINDINGS: CTA CHEST FINDINGS Cardiovascular: --Heart: The heart size is mildly enlarged. There is nopericardial effusion. --Aorta: The course and caliber of the thoracic aorta are normal. There is mild atherosclerotic calcifications seen at the aortic arch and descending intrathoracic aorta. Precontrast images show no aortic intramural hematoma. There is no blood pool, dissection or penetrating ulcer demonstrated on arterial phase postcontrast imaging. There is a conventional 3 vessel aortic arch branching pattern. There is a mild amount of calcification seen at the origin of the great vessels. --Pulmonary Arteries: Contrast timing is optimized for preferential opacification of the aorta. Within that limitation, normal central pulmonary arteries. Mediastinum/Nodes: No mediastinal, hilar or axillary lymphadenopathy. The visualized thyroid and thoracic esophageal course are unremarkable. Lungs/Pleura: No pulmonary nodules or masses. No pleural effusion or pneumothorax. No focal airspace consolidation. No focal pleural abnormality. Musculoskeletal: A left-sided pacemaker is seen with lead tips in the right atrium and right ventricle. No chest wall abnormality. No acute osseous findings. Review of the MIP images confirms the above findings. CTA ABDOMEN AND PELVIS FINDINGS VASCULAR Aorta: Normal caliber aorta without aneurysm, dissection, vasculitis or hemodynamically significant stenosis. There is calcified atherosclerosis and noncalcified thrombus seen within the intra-abdominal aorta predominantly within the infrarenal portion. The maximum diameter of the infrarenal portion of the abdominal aorta is 1.7 cm. Celiac: No aneurysm, dissection or hemodynamically significant stenosis. Normal branching pattern. SMA: Widely patent without dissection or stenosis. Renals: Single renal arteries bilaterally. Atherosclerosis seen at the origins of the single bilateral renal arteries without  significant stenosis. No aneurysm, dissection, stenosis or evidence of fibromuscular dysplasia. IMA: Patent without abnormality. Inflow: No aneurysm, stenosis or dissection. Review of the MIP images confirms the above findings. NON-VASCULAR Hepatobiliary: Normal hepatic contours and density. No visible biliary dilatation. The patient is status post cholecystectomy. No biliary ductal dilation. Pancreas: Normal contours without ductal dilatation. No peripancreatic fluid collection. Spleen: Normal arterial phase splenic enhancement pattern. Adrenals/Urinary Tract: --Adrenal glands: Normal. --Right kidney/ureter: There is a tube cm low-density lesion seen within the upper pole of the right kidney. No hydronephrosis is seen. --Left kidney/ureter: No hydronephrosis or perinephric stranding. No nephrolithiasis. No obstructing ureteral stones. --Urinary bladder: Unremarkable. Stomach/Bowel: --Stomach/Duodenum: No hiatal hernia or other gastric abnormality. Normal duodenal course and caliber. --Small bowel: No dilatation or inflammation. --Colon: There is surgical anastomosis seen at the sigmoid rectal junction. Surrounding mild perirectal thickening is noted, likely postsurgical changes. There is a decompressed sigmoid  colon with apparent bowel wall thickening. --Appendix: Not seen. Lymphatic:  No abdominal or pelvic lymphadenopathy. Reproductive: No free fluid in the pelvis. The patient is status post hysterectomy. Musculoskeletal. No bony spinal canal stenosis or focal osseous abnormality. Other: Abdominal wall mesh hernia is seen. There is a upper small anterior abdominal wall fat containing hernia measuring 3.9 cm in length. Review of the MIP images confirms the above findings. IMPRESSION: 1. No acute intrathoracic aortic abnormality. Mild amount of atherosclerotic plaque. 2. No central or segmental pulmonary embolism. 3. Calcified atherosclerosis and noncalcified thrombus seen within the infrarenal abdominal aorta  without evidence of aneurysmal dilatation or other acute abnormality. Patent celiac, SMA, and IMA. 4. Status post partial colectomy with the anastomosis seen in the rectum. Mild perirectal soft tissue thickening within adjacent apparent sigmoid colonic wall thickening. This could be due to postsurgical/post radiation changes. If further evaluation is required would recommend MRI pelvis or correlation with prior exam. 5. Anterior abdominal wall fat containing hernia. Electronically Signed   By: Prudencio Pair M.D.   On: 01/05/2019 15:01   Ct Head Code Stroke Wo Contrast  Result Date: 01/05/2019 CLINICAL DATA:  Code stroke. Right facial numbness beginning 2 hours ago. Personal history of intracranial aneurysm treated in 1998. EXAM: CT HEAD WITHOUT CONTRAST TECHNIQUE: Contiguous axial images were obtained from the base of the skull through the vertex without intravenous contrast. COMPARISON:  CT head without contrast 01/30/2015 and 11/07/2011 FINDINGS: Brain: Patient is status post right pterional craniotomy. There is chronic encephalomalacia involving the right frontal lobe. Aneurysm clips are stable in position. No acute infarct, hemorrhage, or mass lesion is present. Basal ganglia are within normal limits. Insular ribbon is normal bilaterally. No acute or focal cortical abnormalities are present. No significant white matter lesions are present. The ventricles are of normal size. No significant extraaxial fluid collection is present. Vascular: Right ICA aneurysm clip is in place. Minimal atherosclerotic calcifications are present within the cavernous internal carotid arteries without significant interval change. There is no hyperdense vessel. Skull: Right pterional craniotomy. Calvarium is otherwise intact. No focal lytic or blastic lesions are present. No significant extracranial soft tissue injury is present. Sinuses/Orbits: The paranasal sinuses and mastoid air cells are clear. The globes and orbits are within  normal limits. ASPECTS [facility] Stroke Program Early CT Score) - Ganglionic level infarction (caudate, lentiform nuclei, internal capsule, insula, M1-M3 cortex): 7/7 - Supraganglionic infarction (M4-M6 cortex): 3/3 Total score (0-10 with 10 being normal): 10/10 IMPRESSION: 1. No acute intracranial abnormality or significant interval change 2. 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 3. Right ICA aneurysm clipping is stable 4. ASPECTS is 10/10 These results were called by telephone at the time of interpretation on 01/05/2019 at 12:01 pm to Dr. Francine Graven , who verbally acknowledged these results. Electronically Signed   By: San Morelle M.D.   On: 01/05/2019 12:01      Subjective: Patient says that her chest pain has resolved.  She has no further right facial numbness.  She says that she has no weakness in the extremities and no difficulty with vision changes or hearing changes.  The patient says that she feels at her baseline.  Discharge Exam: Vitals:   01/06/19 1000 01/06/19 1100  BP: 131/82 113/81  Pulse: 64 68  Resp: 18 14  Temp:    SpO2: 96% 99%   Vitals:   01/06/19 0800 01/06/19 0900 01/06/19 1000 01/06/19 1100  BP: 112/80 123/82 131/82 113/81  Pulse: (!) 59  63 64 68  Resp: 14 20 18 14   Temp:      TempSrc:      SpO2: 96% 95% 96% 99%  Weight:      Height:       General: Pt is alert, awake, not in acute distress Cardiovascular: RRR, S1/S2 +, no rubs, no gallops Respiratory: CTA bilaterally, no wheezing, no rhonchi Abdominal: Soft, NT, ND, bowel sounds + Extremities: no edema, no cyanosis/neurological exam, nonfocal exam no focal findings.   The results of significant diagnostics from this hospitalization (including imaging, microbiology, ancillary and laboratory) are listed below for reference.     Microbiology: Recent Results (from the past 240 hour(s))  SARS Coronavirus 2 [facility] order, Performed in [facility] hospital lab)      Status: None   Collection Time: 01/05/19  5:00 PM  Result Value Ref Range Status   SARS Coronavirus 2 NEGATIVE NEGATIVE Final    Comment: (NOTE) SARS CoV 2 target nucleic acids are NOT DETECTED. The SARS CoV 2 RNA is generally detectable in upper and lower respiratory specimens during the acute phase of infection. The lowest concentration of SARS CoV 2 viral copies this assay can detect is 250 copies per mL. A negative result does not preclude SARS CoV 2 infection and should not be used as the sole basis for treatment or other patient management decisions. A negative result may occur with improper specimen collection and handling, submission  of specimen other than nasopharyngeal swab, presence of viral mutation(s) within the areas targeted by this assay, and inadequate number of viral copies (less than 250 copies per mL). A negative result must be combined with clinical observations, patient history,  and epidemiological information. The expected result is Negative. Fact Sheet for Patients:   https [employer] 003704 download Fact Sheet for Healthcare Providers:   https [employer] 402-183-9581 d Jenel Lucks This test is not yet approved or cleared by the [place] FDA and  has been authorized for detection and/or diagnosis of SARS CoV 2 by FDA under an Emergency Use Authorization (EUA).  This EUA will remain in effect (meaning this test can be used) for the duration of  the COVID19 declaration under Section 564(b)(1) of the Act, 21 U.S.C.  section 360bbb-3(b)(1), unless the authorization is terminated or revoked sooner. Performed at [facility], [address], [place], [place] [address]      Labs: BNP (last 3 results) No results for input(s): BNP in the last 8760 hours. Basic Metabolic Panel: Recent Labs  Lab 01/05/19 1149  NA 139  K 3.4*  CL 102  CO2 27  GLUCOSE 105*  BUN 10  CREATININE 0.90  CALCIUM 9.3   Liver Function Tests: Recent Labs  Lab  01/05/19 1149  AST 20  ALT 19  ALKPHOS 63  BILITOT 0.6  PROT 6.9  ALBUMIN 3.9   No results for input(s): LIPASE, AMYLASE in the last 168 hours. No results for input(s): AMMONIA in the last 168 hours. CBC: Recent Labs  Lab 01/05/19 1149  WBC 3.6*  NEUTROABS 2.4  HGB 12.1  HCT 37.9  MCV 93.6  PLT 106*   Cardiac Enzymes: No results for input(s): CKTOTAL, CKMB, CKMBINDEX, TROPONINI in the last 168 hours. BNP: Invalid input(s): POCBNP CBG: Recent Labs  Lab 01/05/19 1209  GLUCAP 100*   D-Dimer No results for input(s): DDIMER in the last 72 hours. Hgb A1c No results for input(s): HGBA1C in the last 72 hours. Lipid Profile Recent Labs  01/06/19 0439  CHOL 193  HDL 33*  LDLCALC 130*  TRIG 151*  CHOLHDL 5.8   Thyroid function studies No results for input(s): TSH, T4TOTAL, T3FREE, THYROIDAB in the last 72 hours.  Invalid input(s): FREET3 Anemia work up No results for input(s): VITAMINB12, FOLATE, FERRITIN, TIBC, IRON, RETICCTPCT in the last 72 hours. Urinalysis    Component Value Date/Time   COLORURINE YELLOW 01/05/2019 1146   APPEARANCEUR CLEAR 01/05/2019 1146   LABSPEC 1.009 01/05/2019 1146   PHURINE 6.0 01/05/2019 1146   GLUCOSEU NEGATIVE 01/05/2019 1146   HGBUR NEGATIVE 01/05/2019 1146   [place] 01/05/2019 1146   KETONESUR NEGATIVE 01/05/2019 1146   PROTEINUR NEGATIVE 01/05/2019 1146   NITRITE NEGATIVE 01/05/2019 1146   LEUKOCYTESUR TRACE (A) 01/05/2019 1146   Sepsis Labs Invalid input(s): PROCALCITONIN,  WBC,  LACTICIDVEN Microbiology Recent Results (from the past 240 hour(s))  SARS Coronavirus 2 [facility] order, Performed in [place] hospital lab)     Status: None   Collection Time: 01/05/19  5:00 PM  Result Value Ref Range Status   SARS Coronavirus 2 NEGATIVE NEGATIVE Final    Comment: (NOTE) SARS CoV 2 target nucleic acids are NOT DETECTED. The SARS CoV 2 RNA is generally detectable in upper and lower respiratory specimens  during the acute phase of infection. The lowest concentration of SARS CoV 2 viral copies this assay can detect is 250 copies per mL. A negative result does not preclude SARS CoV 2 infection and should not be used as the sole basis for treatment or other patient management decisions. A negative result may occur with improper specimen collection and handling, submission  of specimen other than nasopharyngeal swab, presence of viral mutation(s) within the areas targeted by this assay, and inadequate number of viral copies (less than 250 copies per mL). A negative result must be combined with clinical observations, patient history,  and epidemiological information. The expected result is Negative. Fact Sheet for Patients:   https [employer] 240973 download Fact Sheet for Healthcare Providers:   https [employer] 434-413-5859 d Jenel Lucks This test is not yet approved or cleared by the [place] FDA and  has been authorized for detection and/or diagnosis of SARS CoV 2 by FDA under an Emergency Use Authorization (EUA).  This EUA will remain in effect (meaning this test can be used) for the duration of  the COVID19 declaration under Section 564(b)(1) of the Act, 21 U.S.C.  section 360bbb-3(b)(1), unless the authorization is terminated or revoked sooner. Performed at [facility], [address], [place], [place] [address]    Time coordinating discharge:  SIGNED:  Irwin Brakeman, MD  [facility] 01/06/2019, 11:20 AM How to contact the [facility] Attending or Consulting provider [place] or covering provider during after hours [place], for this patient?  1. Check the care team in [facility] and look for a) attending/consulting TRH provider listed and b) the [facility] team listed 2. Log into [email or web address] and use [facility]'s universal password to access. If you do not have the password, please contact the hospital operator. 3. Locate the [facility] provider you are looking for under [facility]  and page to a number that you can be directly reached. 4. If you still have difficulty reaching the provider, please page the [facility] (Director on Call) for the Hospitalists listed on amion for assistance.

## 2019-01-06 NOTE — Evaluation (Signed)
Physical Therapy Evaluation Patient Details Name: Robin Wolfe MRN: [id number] DOB: 11-Aug-1955 Today's Date: 01/06/2019   History of Present Illness   Robin Wolfe  is a 63 y.o. female, w h/o rectal adenocarcinoma s/p chemo/ xrt, hyperlipidemia, anxiety, h/o bradycardia s/p pacer, CAD s/p  cardiac catheterization 01/16/2015-> prox LAD 30%, 1st diag 30%  , c/o chest pain starting this morning at rest.  Pt states pain "sharp" right sided and radiating to the back. Pt notes has had intermittent pain x 1 week.  Pt denies fever, chills, cough, palp, n/v, abd pain, diarrhea, brbpr, black stool.  Pt then states that she had right sided facial numbness about 2 hours prior to coming to ED.      Clinical Impression  Patient functioning at baseline for functional mobility and gait.  Plan:  Patient discharged from physical therapy to care of nursing for ambulation daily as tolerated for length of stay.     Follow Up Recommendations No PT follow up    Equipment Recommendations  None recommended by PT    Recommendations for Other Services       Precautions / Restrictions Precautions Precautions: None Restrictions Weight Bearing Restrictions: No      Mobility  Bed Mobility Overal bed mobility: Independent                Transfers Overall transfer level: Independent                  Ambulation/Gait Ambulation/Gait assistance: Independent Gait Distance (Feet): 120 Feet Assistive device: None Gait Pattern/deviations: WFL(Within Functional Limits) Gait velocity: normal   General Gait Details: no loss of balance  Stairs            Wheelchair Mobility    Modified Rankin (Stroke Patients Only)       Balance Overall balance assessment: Independent                                           Pertinent Vitals/Pain Pain Assessment: No/denies pain    Home Living Family/patient expects to be discharged to:: Private residence Living Arrangements:  Other relatives Available Help at Discharge: Family;Available PRN/intermittently Type of Home: House Home Access: Stairs to enter Entrance Stairs-Rails: Right;Left;Can reach both Entrance Stairs-Number of Steps: 4 Home Layout: One level Home Equipment: None      Prior Function Level of Independence: Independent         Comments: community ambulator, drives, works as Designer, television/film set   Dominant Hand: Right    Extremity/Trunk Assessment   Upper Extremity Assessment Upper Extremity Assessment: Overall WFL for tasks assessed    Lower Extremity Assessment Lower Extremity Assessment: Overall WFL for tasks assessed    Cervical / Trunk Assessment Cervical / Trunk Assessment: Normal  Communication   Communication: No difficulties  Cognition Arousal/Alertness: Awake/alert Behavior During Therapy: WFL for tasks assessed/performed Overall Cognitive Status: Within Functional Limits for tasks assessed                                        General Comments      Exercises     Assessment/Plan    PT Assessment Patent does not need any further PT services  PT Problem List  PT Treatment Interventions      PT Goals (Current goals can be found in the Care Plan section)  Acute Rehab PT Goals Patient Stated Goal: return home PT Goal Formulation: With patient Time For Goal Achievement: 01/06/19 Potential to Achieve Goals: Good    Frequency     Barriers to discharge        Co-evaluation               AM-PAC PT "6 Clicks" Mobility  Outcome Measure Help needed turning from your back to your side while in a flat bed without using bedrails?: None Help needed moving from lying on your back to sitting on the side of a flat bed without using bedrails?: None Help needed moving to and from a bed to a chair (including a wheelchair)?: None Help needed standing up from a chair using your arms (e.g., wheelchair or bedside chair)?:  None Help needed to walk in hospital room?: None Help needed climbing 3-5 steps with a railing? : None 6 Click Score: 24    End of Session   Activity Tolerance: Patient tolerated treatment well Patient left: in bed;with call bell/phone within reach Nurse Communication: Mobility status PT Visit Diagnosis: Unsteadiness on feet (R26.81);Other abnormalities of gait and mobility (R26.89);Muscle weakness (generalized) (M62.81)    Time: 3704-8889 PT Time Calculation (min) (ACUTE ONLY): 16 min   Charges:   PT Evaluation $PT Eval Low Complexity: 1 Low PT Treatments $Gait Training: 8-22 mins        1:58 PM, 01/06/19 Lonell Grandchild, MPT Physical Therapist with [facility] [phone] office [phone] mobile phone

## 2019-01-06 NOTE — Progress Notes (Addendum)
Progress Note  Patient Name: Robin Wolfe Date of Encounter: 01/06/2019  Primary Cardiologist: Skeet Latch, MD  EP: Dr. Caryl Comes  Subjective   She denies any recurrent pain overnight or this AM. No recurrent facial numbness. Feels back to baseline.   Inpatient Medications    Scheduled Meds:   stroke: mapping our early stages of recovery book   Does not apply Once   aspirin  300 mg Rectal Daily   Or   aspirin  325 mg Oral Daily   Carboxymethylcellulose Sodium  1 drop Both Eyes BID   escitalopram  10 mg Oral QHS   ezetimibe  10 mg Oral QHS   nitroGLYCERIN  0.4 mg Sublingual Once   ondansetron  4 mg Intravenous Once   oxybutynin  10 mg Oral Daily   pravastatin  40 mg Oral q1800   psyllium  1 packet Oral Daily   traZODone  100 mg Oral QHS   Continuous Infusions:  PRN Meds: acetaminophen **OR** acetaminophen (TYLENOL) oral liquid 160 mg/5 mL **OR** acetaminophen, morphine injection   Vital Signs    Vitals:   01/06/19 0600 01/06/19 0700 01/06/19 0800 01/06/19 0900  BP: 118/85 106/71 112/80 123/82  Pulse: 62 63 (!) 59 63  Resp: 15 15 14 20   Temp:      TempSrc:      SpO2: 94% 92% 96% 95%  Weight:      Height:       No intake or output data in the 24 hours ending 01/06/19 0947  Last 3 Weights 01/05/2019 12/25/2017 11/19/2017  Weight (lbs) 153 lb 153 lb 153 lb 6.4 oz  Weight (kg) 69.4 kg 69.4 kg 69.582 kg      Telemetry    A-paced, HR in 60's to 70's. No significant arrhythmias.  - Personally Reviewed  ECG    No new tracings.   Physical Exam   General: Well developed, well nourished, female appearing in no acute distress. Head: Normocephalic, atraumatic.  Neck: Supple without bruits, JVD not elevated. Lungs:  Resp regular and unlabored, CTA without wheezing or rales. Heart: RRR, S1, S2, no S3, S4, or murmur; no rub. Abdomen: Soft, non-tender, non-distended with normoactive bowel sounds. No hepatomegaly. No rebound/guarding. No obvious abdominal  masses. Extremities: No clubbing, cyanosis, or lower extremity edema. Distal pedal pulses are 2+ bilaterally. Neuro: Alert and oriented X 3. Moves all extremities spontaneously. Psych: Normal affect.  Labs    Chemistry Recent Labs  Lab 01/05/19 1149  NA 139  K 3.4*  CL 102  CO2 27  GLUCOSE 105*  BUN 10  CREATININE 0.90  CALCIUM 9.3  PROT 6.9  ALBUMIN 3.9  AST 20  ALT 19  ALKPHOS 63  BILITOT 0.6  GFRNONAA >60  GFRAA >60  ANIONGAP 10     Hematology Recent Labs  Lab 01/05/19 1149  WBC 3.6*  RBC 4.05  HGB 12.1  HCT 37.9  MCV 93.6  MCH 29.9  MCHC 31.9  RDW 13.9  PLT 106*    Cardiac EnzymesNo results for input(s): TROPONINI in the last 168 hours. No results for input(s): TROPIPOC in the last 168 hours.   BNPNo results for input(s): BNP, PROBNP in the last 168 hours.   DDimer No results for input(s): DDIMER in the last 168 hours.   Radiology    US Carotid Bilateral (at [facility] Only)  Result Date: 01/06/2019 CLINICAL DATA:  63 year old female with a history of TIA EXAM: BILATERAL CAROTID DUPLEX ULTRASOUND TECHNIQUE: Pearline Cables  scale imaging, color Doppler and duplex ultrasound were performed of bilateral carotid and vertebral arteries in the neck. COMPARISON:  None. FINDINGS: Criteria: Quantification of carotid stenosis is based on velocity parameters that correlate the residual internal carotid diameter with NASCET-based stenosis levels, using the diameter of the distal internal carotid lumen as the denominator for stenosis measurement. The following velocity measurements were obtained: RIGHT ICA:  Systolic 98 cm/sec, Diastolic 37 cm/sec CCA:  68 cm/sec SYSTOLIC ICA/CCA RATIO:  1.4 ECA:  59 cm/sec LEFT ICA:  Systolic 401 cm/sec, Diastolic 47 cm/sec CCA:  79 cm/sec SYSTOLIC ICA/CCA RATIO:  1.5 ECA:  65 cm/sec Right Brachial SBP: Not acquired Left Brachial SBP: Not acquired RIGHT CAROTID ARTERY: No significant calcified disease of the right common carotid artery.  Intermediate waveform maintained. Heterogeneous plaque without significant calcifications at the right carotid bifurcation. Low resistance waveform of the right ICA. No significant tortuosity. RIGHT VERTEBRAL ARTERY: Antegrade flow with low resistance waveform. LEFT CAROTID ARTERY: No significant calcified disease of the left common carotid artery. Intermediate waveform maintained. Heterogeneous plaque at the left carotid bifurcation without significant calcifications. Low resistance waveform of the left ICA. LEFT VERTEBRAL ARTERY: Antegrade flow, with late reversal of systole and then antegrade flow in diastole. IMPRESSION: Color duplex indicates moderate heterogeneous plaque with no hemodynamically significant stenosis by duplex criteria in the extracranial cerebrovascular circulation. 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 Signed, Dulcy Fanny. Dellia Nims, RPVI [facility] Radiology Electronically Signed   By: Corrie Mckusick D.O.   On: 01/06/2019 07:28   Dg Chest Portable 1 View  Result Date: 01/05/2019 CLINICAL DATA:  February 20, 2017 EXAM: PORTABLE CHEST 1 VIEW COMPARISON:  February 20, 2017 FINDINGS: No edema or consolidation. Heart is upper normal in size with pulmonary vascularity normal. Pacemaker leads are attached to the right atrium and right ventricle. There is aortic atherosclerosis. No adenopathy. No bone lesions. IMPRESSION: No acute cardiopulmonary finding. Pacemaker leads attached to right atrium and right ventricle. Heart upper normal in size. No edema or consolidation. Aortic Atherosclerosis (ICD10-I70.0). Electronically Signed   By: Lowella Grip III M.D.   On: 01/05/2019 11:32   Ct Angio Chest/abd/pel For Dissection W And/or Wo Contrast  Result Date: 01/05/2019 CLINICAL DATA:  Chest pain, question of aortic dissection mid upper back pain for 1 and half week  Radian thick chest EXAM: CT ANGIOGRAPHY CHEST, ABDOMEN AND PELVIS TECHNIQUE: Multidetector CT imaging through the chest, abdomen and pelvis was performed using the standard protocol during bolus administration of intravenous contrast. Multiplanar reconstructed images and MIPs were obtained and reviewed to evaluate the vascular anatomy. CONTRAST:  163mL OMNIPAQUE IOHEXOL 350 MG/ML SOLN COMPARISON:  None. FINDINGS: CTA CHEST FINDINGS Cardiovascular: --Heart: The heart size is mildly enlarged. There is nopericardial effusion. --Aorta: The course and caliber of the thoracic aorta are normal. There is mild atherosclerotic calcifications seen at the aortic arch and descending intrathoracic aorta. Precontrast images show no aortic intramural hematoma. There is no blood pool, dissection or penetrating ulcer demonstrated on arterial phase postcontrast imaging. There is a conventional 3 vessel aortic arch branching pattern. There is a mild amount of calcification seen at the origin of the great vessels. --Pulmonary Arteries: Contrast timing is optimized for preferential opacification of the aorta. Within that limitation, normal central pulmonary arteries. Mediastinum/Nodes: No mediastinal, hilar or axillary lymphadenopathy. The visualized thyroid and thoracic esophageal course are unremarkable. Lungs/Pleura: No pulmonary nodules or masses. No pleural  effusion or pneumothorax. No focal airspace consolidation. No focal pleural abnormality. Musculoskeletal: A left-sided pacemaker is seen with lead tips in the right atrium and right ventricle. No chest wall abnormality. No acute osseous findings. Review of the MIP images confirms the above findings. CTA ABDOMEN AND PELVIS FINDINGS VASCULAR Aorta: Normal caliber aorta without aneurysm, dissection, vasculitis or hemodynamically significant stenosis. There is calcified atherosclerosis and noncalcified thrombus seen within the intra-abdominal aorta predominantly within the infrarenal  portion. The maximum diameter of the infrarenal portion of the abdominal aorta is 1.7 cm. Celiac: No aneurysm, dissection or hemodynamically significant stenosis. Normal branching pattern. SMA: Widely patent without dissection or stenosis. Renals: Single renal arteries bilaterally. Atherosclerosis seen at the origins of the single bilateral renal arteries without significant stenosis. No aneurysm, dissection, stenosis or evidence of fibromuscular dysplasia. IMA: Patent without abnormality. Inflow: No aneurysm, stenosis or dissection. Review of the MIP images confirms the above findings. NON-VASCULAR Hepatobiliary: Normal hepatic contours and density. No visible biliary dilatation. The patient is status post cholecystectomy. No biliary ductal dilation. Pancreas: Normal contours without ductal dilatation. No peripancreatic fluid collection. Spleen: Normal arterial phase splenic enhancement pattern. Adrenals/Urinary Tract: --Adrenal glands: Normal. --Right kidney/ureter: There is a tube cm low-density lesion seen within the upper pole of the right kidney. No hydronephrosis is seen. --Left kidney/ureter: No hydronephrosis or perinephric stranding. No nephrolithiasis. No obstructing ureteral stones. --Urinary bladder: Unremarkable. Stomach/Bowel: --Stomach/Duodenum: No hiatal hernia or other gastric abnormality. Normal duodenal course and caliber. --Small bowel: No dilatation or inflammation. --Colon: There is surgical anastomosis seen at the sigmoid rectal junction. Surrounding mild perirectal thickening is noted, likely postsurgical changes. There is a decompressed sigmoid colon with apparent bowel wall thickening. --Appendix: Not seen. Lymphatic:  No abdominal or pelvic lymphadenopathy. Reproductive: No free fluid in the pelvis. The patient is status post hysterectomy. Musculoskeletal. No bony spinal canal stenosis or focal osseous abnormality. Other: Abdominal wall mesh hernia is seen. There is a upper small anterior  abdominal wall fat containing hernia measuring 3.9 cm in length. Review of the MIP images confirms the above findings. IMPRESSION: 1. No acute intrathoracic aortic abnormality. Mild amount of atherosclerotic plaque. 2. No central or segmental pulmonary embolism. 3. Calcified atherosclerosis and noncalcified thrombus seen within the infrarenal abdominal aorta without evidence of aneurysmal dilatation or other acute abnormality. Patent celiac, SMA, and IMA. 4. Status post partial colectomy with the anastomosis seen in the rectum. Mild perirectal soft tissue thickening within adjacent apparent sigmoid colonic wall thickening. This could be due to postsurgical/post radiation changes. If further evaluation is required would recommend MRI pelvis or correlation with prior exam. 5. Anterior abdominal wall fat containing hernia. Electronically Signed   By: Prudencio Pair M.D.   On: 01/05/2019 15:01   Ct Head Code Stroke Wo Contrast  Result Date: 01/05/2019 CLINICAL DATA:  Code stroke. Right facial numbness beginning 2 hours ago. Personal history of intracranial aneurysm treated in 1998. EXAM: CT HEAD WITHOUT CONTRAST TECHNIQUE: Contiguous axial images were obtained from the base of the skull through the vertex without intravenous contrast. COMPARISON:  CT head without contrast 01/30/2015 and 11/07/2011 FINDINGS: Brain: Patient is status post right pterional craniotomy. There is chronic encephalomalacia involving the right frontal lobe. Aneurysm clips are stable in position. No acute infarct, hemorrhage, or mass lesion is present. Basal ganglia are within normal limits. Insular ribbon is normal bilaterally. No acute or focal cortical abnormalities are present. No significant white matter lesions are present. The ventricles are of normal size. No significant  extraaxial fluid collection is present. Vascular: Right ICA aneurysm clip is in place. Minimal atherosclerotic calcifications are present within the cavernous internal  carotid arteries without significant interval change. There is no hyperdense vessel. Skull: Right pterional craniotomy. Calvarium is otherwise intact. No focal lytic or blastic lesions are present. No significant extracranial soft tissue injury is present. Sinuses/Orbits: The paranasal sinuses and mastoid air cells are clear. The globes and orbits are within normal limits. ASPECTS [facility] Stroke Program Early CT Score) - Ganglionic level infarction (caudate, lentiform nuclei, internal capsule, insula, M1-M3 cortex): 7/7 - Supraganglionic infarction (M4-M6 cortex): 3/3 Total score (0-10 with 10 being normal): 10/10 IMPRESSION: 1. No acute intracranial abnormality or significant interval change 2. 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 3. Right ICA aneurysm clipping is stable 4. ASPECTS is 10/10 These results were called by telephone at the time of interpretation on 01/05/2019 at 12:01 pm to Dr. Francine Graven , who verbally acknowledged these results. Electronically Signed   By: San Morelle M.D.   On: 01/05/2019 12:01    Cardiac Studies   Echocardiogram: 02/2017 Study Conclusions  - Left ventricle: The cavity size was normal. Wall thickness was   normal. Systolic function was normal. The estimated ejection   fraction was in the range of 55% to 60%. Wall motion was normal;   there were no regional wall motion abnormalities. The study is   not technically sufficient to allow evaluation of LV diastolic   function. - Aortic valve: Mildly calcified annulus. Trileaflet; mildly   calcified leaflets. - Mitral valve: There was trivial regurgitation. - Right ventricle: Pacer wire or catheter noted in right ventricle. - Atrial septum: No defect or patent foramen ovale was identified. - Tricuspid valve: There was trivial regurgitation. - Pulmonary arteries: PA peak pressure: 25 mm Hg (S). - Pericardium, extracardiac: There was no pericardial  effusion.  Impressions:  - Normal LV wall thickness with LVEF 55-60%. Indeterminate   diastolic function. Trivial mitral regurgitation. Mildly   sclerotic aortic valve. Device wire present within the right   heart. Trivial tricuspid regurgitation with PASP estimated 25   mmHg.   Cardiac Catheterization: 01/2015  Prox LAD lesion, 30% stenosed.  1st Diag lesion, 30% stenosed.  The left ventricular systolic function is normal.   1. Mild non-obstructive CAD 2. Normal LV systolic function  Recommendations: Medical management of mild CAD.   Patient Profile     63 y.o. female w/ PMH of SSS (s/p Biotronik PPM placement), CAD (mild CAD by cath in 01/2015 as outlined above), rectal cancer (s/p resection and chemotherapy), and right ICA aneurysm (s/p clipping) who presented to [facility] ED on 01/05/2019 for evaluation of chest pain and right-sided facial numbness.   Assessment & Plan    1. Atypical Chest Pain - she presented with episodes of pain which could last for 4-5 hours and not exacerbated by exertion. Her pain the day of admission had lasted for 7+ hours.  - HS Troponin values have been flat at 32, 31, and 29. EKG shows no acute ischemic changes when compared to prior tracings. CTA negative for PE or dissection but did show a noncalcified thrombus seen within the infrarenal abdominal aorta without evidence of aneurysmal dilatation or other acute abnormality Echocardiogram is pending to assess LV function and wall motion. If no significant abnormalities, could consider a Coronary CT as an outpatient if recurrent symptoms given mild CAD by prior cath 4 years ago.   2. SSS - s/p Biotronik PPM  placement which is followed by Dr. Caryl Comes. Most recent interrogation in 06/2018 showed normal device function.   3. Right Facial Numbness - CT Head on admission showed no acute intracranial abnormality or significant interval change. Was noted to have stable chronic encephalomalacia  involving the right frontal lobe and stable right ICA aneurysm clipping. - carotid dopplers show heterogeneous plaque with no hemodynamically significant stenosis. She has been started on statin therapy.  - further evaluation per Neurology and admitting team.   For questions or updates, please contact [place] Please consult [email or web address] for contact info under Cardiology/STEMI.   Arna Medici , PA-C 9:47 AM 01/06/2019 Pager: [phone]  The patient was seen and examined, and I agree with the history, physical exam, assessment and plan as documented above, with modifications as noted below. I have also personally reviewed all relevant documentation, old records, labs, and both radiographic and cardiovascular studies. I have also independently interpreted old and new ECG's.  She has had no recurrent chest pain.  She describes the chest pain as being located in the right inframammary region and radiating to the back.  She also describes significant thoracic and lumbar back spasms which can radiate to the chest.  Chest pain was atypical in etiology.  ECG is nonischemic and troponins are unremarkable.  I personally reviewed the echocardiogram which demonstrated normal left ventricular systolic function, LVEF 1779%, and normal regional wall motion.  There was mild mitral and tricuspid regurgitation.  The patient can follow-up with her primary cardiologist.  An outpatient Lexiscan could be considered if deemed necessary.  She can be discharged from my standpoint.   Kate Sable, MD, [facility]  01/06/2019 10:56 AM

## 2019-06-25 ENCOUNTER — Encounter: Payer: Self-pay | Admitting: Internal Medicine

## 2019-08-30 ENCOUNTER — Encounter: Payer: 59 | Admitting: Internal Medicine

## 2019-09-14 ENCOUNTER — Encounter: Payer: 59 | Admitting: Internal Medicine

## 2019-09-22 ENCOUNTER — Encounter: Payer: Self-pay | Admitting: Gastroenterology

## 2019-09-29 ENCOUNTER — Encounter: Payer: Self-pay | Admitting: Gastroenterology

## 2019-10-18 NOTE — Progress Notes (Signed)
Referring Provider: Celene Squibb, MD Primary Care Physician:  Celene Squibb, MD Primary GI Physician: Dr. Oneida Alar ( Dr. Gala Romney following for now)  Chief Complaint  Patient presents with  . Dysphagia    coughs, gets strangled    HPI:   IVERSON Wolfe is a 64 y.o. female presenting today for dysphagia.  GI history of rectal adenocarcinoma stage III s/p resection/chemo in 2008, colonoscopy in July 2019 with external hemorrhoids, otherwise normal exam with recommendations to repeat in 5 years with pediatric colonoscope.  Last seen in our office in June 2019 prior to colonoscopy.  Also address at that time was postprandial gas/bloating with associated cramping that have been present for years.  She was advised to add lactase 3 pills with meals up to 3 times a day, use Gas-X when needed, and call if symptoms do not improve.  Per chart review, patient was seen in the ED in August 2020 for unstable angina, right facial numbness, and TIA.  CT head with no acute findings.  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  Troponin was elevated at 29.  Neuro recommended observation, vascular study, and starting antiplatelet therapy.  CTA without dissection.  Looks like plans were for admission for ACS rule out and stroke eval but it does not appear patient was ever admitted.  Had first follow-up with cardiology since presenting to the ED on 10/19/2019. She was without CP or other concerning signs/symptoms. No additional workup planned. Planned to follow-up in 1 year.   Notably, CT angiography also showed mild perirectal soft tissue thickening with adjacent apparent sigmoid colonic wall thickening.  States this could be due to postsurgical/post radiation changes.  If further evaluation is required would recommend MRI pelvis or correlation with prior exam.   Reviewed recent PCP note dated 09/29/2019.  Patient reported sharp stabbing pain going from her back to the middle of her epigastric area with  associated nausea relieved by eating.  Also with some difficulty swallowing, getting strangled easily with liquids and food.  She was prescribed omeprazole 40 mg twice daily, Carafate 3 times daily, and referred to GI.  Recent labs completed with PCP 09/23/2019.  CBC with platelets 113 (L) which is chronic, otherwise normal.  CMP within normal limits.  Today: Started with waves of nausea with associated epigastric pain radiating to her back a couple months ago.  No vomiting. Symptoms improve with eating. Taking omeprazole once a day and sometimes twice a day. Not taking carafate. Upper abdominal pain has improved somewhat since starting omeprazole. Continues with intermittent symptoms. States she never knows when it will hit. Tries to snack throughout the day because eating seems to always help. Rare ibuprofen. No other NSAIDs.   Dysphagia: After eating or drinking something, she will have a lot of coughing. Gets strangled easily. Started 6 months ago. Feels foods or liquids will go down her windpipe. This causes her to cough. No foods getting hung in her esophagus. Occurring with all meals essentially. Will still occur with eating slowly. Feels she has a lot of mucous in her throat.   Chronic intermittent GERD for years. Became daily several months ago. Had never been on anything regularly until a few weeks ago when started in omeprazole by PCP.  Symptoms are improved.  Used to use OTC Prilosec as needed. Worsening reflux didn't coenside with worsening dysphagia.   No black stools or blood in the stool. BMs daily. No unintentional weight loss. No lower abdominal  pain. Discussed CT abnormalities with patient. She wants to hold off on repeat colonoscopy. States she can't have a MRI due to pacemaker.   Past Medical History:  Diagnosis Date  . Hypercholesteremia   . Insomnia   . Presence of permanent cardiac pacemaker   . Rectal adenocarcinoma ([facility]) 2008   Stage III, T2N1M0, s/p resection/chemo  .  S/P placement of cardiac pacemaker, 02/08/15, Biotronik, MIR compatibile  02/09/2015  . Symptomatic sinus bradycardia 02/09/2015  . Thrombocytopenia ([place])   . Ventral hernia     Past Surgical History:  Procedure Laterality Date  . ABDOMINAL HYSTERECTOMY     endometriosis  . CARDIAC CATHETERIZATION N/A 01/16/2015   Procedure: Left Heart Cath and Coronary Angiography;  Surgeon: Burnell Blanks, MD;  Location: [facility];  Service: Cardiovascular;  Laterality: N/A;  . CEREBRAL ANEURYSM REPAIR    . CHOLECYSTECTOMY     ileostomyw/ takedown 8/09 Dr Crisoforo Oxford  . COLON SURGERY    . 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 COLONOSCOPY  09/12/2008    Small rectal pouch, which made retroflexion challenging/ no polyps detected in the rectum/ No diverticula  . COLONOSCOPY  01/21/2012   Procedure: COLONOSCOPY;  Surgeon: Danie Binder, MD;  [facility];  Service: Endoscopy;  Laterality: N/A;  . COLONOSCOPY N/A 12/25/2017   Procedure: COLONOSCOPY;  Surgeon: Danie Binder, MD;  [facility];  Service: Endoscopy;  Laterality: N/A;  1:00pm  . EP IMPLANTABLE DEVICE N/A 02/08/2015   Procedure: Pacemaker Implant;  Surgeon: Deboraha Sprang, MD;  Location: [facility];  Service: Cardiovascular;  Laterality: N/A;  . SIGMOIDOSCOPY  12/24/2006   rectal adenocarcinoma/Normal retroflexed view of the rectum  . VENTRAL HERNIA REPAIR      Current Outpatient Medications  Medication Sig Dispense Refill  . ALPRAZolam (XANAX) 0.5 MG tablet Take 0.5 mg by mouth as needed.    . Ascorbic Acid (VITA-C PO) Take 1 capsule by mouth daily. Pt unsure of dosage    . Carboxymethylcellulose Sodium (THERATEARS) 0.25 % SOLN Place 1 drop into both eyes 2 (two) times daily.    Marland Kitchen escitalopram  (LEXAPRO) 10 MG tablet Take 10 mg by mouth at bedtime.    Marland Kitchen ibuprofen (ADVIL,MOTRIN) 200 MG tablet Take 400 mg by mouth every 8 (eight) hours as needed (for pain.).     Marland Kitchen NEXLETOL 180 MG TABS Take 1 tablet by mouth at bedtime.    Marland Kitchen omeprazole (PRILOSEC) 40 MG capsule Take 40 mg by mouth 2 (two) times daily.    Marland Kitchen OVER THE COUNTER MEDICATION TOTAL RESTORE for the gut 2 capsule daily    . OVER THE COUNTER MEDICATION Sambucus Elderberry with Zinc and Vitamin C 1 capsule by mouth once a day.    . oxybutynin (DITROPAN-XL) 10 MG 24 hr tablet Take 10 mg by mouth daily.     . psyllium (METAMUCIL) 58.6 % powder Take 1 packet by mouth daily.    . traZODone (DESYREL) 100 MG tablet Take 100 mg by mouth at bedtime.     Marland Kitchen VITAMIN D PO Take 5,000 Units by mouth daily.     No current facility-administered medications for this visit.    Allergies as of 10/20/2019 - Review Complete 10/20/2019  Allergen Reaction Noted  .  Compazine Other (See Comments) 12/31/2010  . Hydromorphone hcl Other (See Comments)   . Suprep [na sulfate-k sulfate-mg sulf]  12/25/2017    Family History  Problem Relation Age of Onset  . Heart disease Other   . Lung disease Other   . Diabetes Other   . Hyperlipidemia Mother   . CAD Father   . Stroke Father   . Colon cancer Neg Hx     Social History   Socioeconomic History  . Marital status: Widowed    Spouse name: Not on file  . Number of children: Not on file  . Years of education: Not on file  . Highest education level: Not on file  Occupational History  . Not on file  Tobacco Use  . Smoking status: Former Smoker    Packs/day: 1.50    Years: 8.00    Pack years: 12.00    Types: Cigarettes    Quit date: 01/06/2007    Years since quitting: 12.7  . Smokeless tobacco: Never Used  Substance and Sexual Activity  . Alcohol use: Yes    Alcohol/week: 0.0 standard drinks    Comment: rarely  . Drug use: No  . Sexual activity: Yes    Birth control/protection: Surgical    Other Topics Concern  . Not on file  Social History Narrative  . Not on file   Social Determinants of Health   Financial Resource Strain: Low Risk   . Difficulty of Paying Living Expenses: Not very hard  Food Insecurity: No Food Insecurity  . Worried About Charity fundraiser in the Last Year: Never true  . Ran Out of Food in the Last Year: Never true  Transportation Needs: No Transportation Needs  . Lack of Transportation (Medical): No  . Lack of Transportation (Non-Medical): No  Physical Activity:   . Days of Exercise per Week:   . Minutes of Exercise per Session:   Stress:   . Feeling of Stress :   Social Connections: Unknown  . Frequency of Communication with Friends and Family: Not on file  . Frequency of Social Gatherings with Friends and Family: Not on file  . Attends Religious Services: Not on file  . Active Member of Clubs or Organizations: Not on file  . Attends Archivist Meetings: Not on file  . Marital Status: Widowed    Review of Systems: Gen: Denies fever, chills, lightheadedness, dizziness, presyncope, syncope CV: Denies chest pain or palpitations. Resp: Denies dyspnea at rest or cough. GI: See HPI Derm: Denies rash Heme: See HPI  Physical Exam: BP (!) 158/79   Pulse 66   Temp (!) 97.3 F (36.3 C) (Temporal)   Ht 5\' 4"  (1.626 m)   Wt 157 lb 6.4 oz (71.4 kg)   BMI 27.02 kg/m  General:   Alert and oriented. No distress noted. Pleasant and cooperative.  Head:  Normocephalic and atraumatic. Eyes:  Conjuctiva clear without scleral icterus. Heart:  S1, S2 present without murmurs appreciated. Lungs:  Clear to auscultation bilaterally. No wheezes, rales, or rhonchi. No distress.  Abdomen:  +BS, soft, non-tender and non-distended. No rebound or guarding. No HSM or masses noted. Msk:  Symmetrical without gross deformities. Normal posture. Extremities:  Without edema. Neurologic:  Alert and  oriented x4 Psych:  Normal mood and affect.

## 2019-10-19 ENCOUNTER — Other Ambulatory Visit: Payer: Self-pay

## 2019-10-19 ENCOUNTER — Encounter: Payer: Self-pay | Admitting: Internal Medicine

## 2019-10-19 ENCOUNTER — Ambulatory Visit: Payer: 59 | Admitting: Internal Medicine

## 2019-10-19 VITALS — BP 124/70 | HR 73 | Ht 64.0 in | Wt 158.6 lb

## 2019-10-19 DIAGNOSIS — I495 Sick sinus syndrome: Secondary | ICD-10-CM | POA: Diagnosis not present

## 2019-10-19 DIAGNOSIS — Z950 Presence of cardiac pacemaker: Secondary | ICD-10-CM | POA: Diagnosis not present

## 2019-10-19 LAB — CUP PACEART INCLINIC DEVICE CHECK
Date Time Interrogation Session: 20210518162457
Implantable Lead Implant Date: 20160907
Implantable Lead Implant Date: 20160907
Implantable Lead Location: 753859
Implantable Lead Location: 753860
Implantable Lead Model: 5076
Implantable Lead Model: 5076
Implantable Pulse Generator Implant Date: 20160907
Pulse Gen Model: 394929
Pulse Gen Serial Number: 68620792

## 2019-10-19 NOTE — Patient Instructions (Signed)
Medication Instructions:  Your physician recommends that you continue on your current medications as directed. Please refer to the Current Medication list given to you today.  Labwork: None ordered.  Testing/Procedures: None ordered.  Follow-Up: Your physician wants you to follow-up in: 12 months with APP. You will receive a reminder letter in the mail two months in advance. If you don't receive a letter, please call our office to schedule the follow-up appointment.  Remote monitoring is used to monitor your Pacemaker of ICD from home. This monitoring reduces the number of office visits required to check your device to one time per year. It allows [place] to keep an eye on the functioning of your device to ensure it is working properly.  Any Other Special Instructions Will Be Listed Below (If Applicable).  If you need a refill on your cardiac medications before your next appointment, please call your pharmacy.

## 2019-10-19 NOTE — Progress Notes (Signed)
Agree Plavix and what     Patient Care Team: Celene Squibb, MD as PCP - General (Internal Medicine) Skeet Latch, MD as PCP - Cardiology (Cardiology) Danie Binder, MD (Inactive) (Gastroenterology)   HPI  Robin Wolfe is a 64 y.o. female Seen in follow-up for symptomatic bradycardia  perhaps with a component of autonomic insufficiency. She mild appetite underwent pacing with a Biotronik device 9/16.    The patient denies chest pain, shortness of breath, nocturnal dyspnea, orthopnea or peripheral edema.  There have been no palpitations, lightheadedness or syncope.    Her husband died 3 yrs  with nonsmall cell ca She her self works  at hospice    The strong family history.  Hypercholesterolemia risk factors.  Records and Results Reviewed NONE  Past Medical History:  Diagnosis Date  . Hypercholesteremia   . Insomnia   . Presence of permanent cardiac pacemaker   . Rectal adenocarcinoma ([place]) 2008   Stage III, T2N1M0, s/p resection/chemo  . S/P placement of cardiac pacemaker, 02/08/15, Biotronik, MIR compatibile  02/09/2015  . Symptomatic sinus bradycardia 02/09/2015  . Thrombocytopenia ([place])   . Ventral hernia     Past Surgical History:  Procedure Laterality Date  . ABDOMINAL HYSTERECTOMY     endometriosis  . CARDIAC CATHETERIZATION N/A 01/16/2015   Procedure: Left Heart Cath and Coronary Angiography;  Surgeon: Burnell Blanks, MD;  Location: [facility];  Service: Cardiovascular;  Laterality: N/A;  . CEREBRAL ANEURYSM REPAIR    . CHOLECYSTECTOMY     ileostomyw/ takedown 8/09 Dr Crisoforo Oxford  . COLON SURGERY    . 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 COLONOSCOPY  09/12/2008    Small rectal pouch, which made retroflexion challenging/ no polyps detected in  the rectum/ No diverticula  . COLONOSCOPY  01/21/2012   Procedure: COLONOSCOPY;  Surgeon: Danie Binder, MD;  [facility];  Service: Endoscopy;  Laterality: N/A;  . COLONOSCOPY N/A 12/25/2017   Procedure: COLONOSCOPY;  Surgeon: Danie Binder, MD;  [facility];  Service: Endoscopy;  Laterality: N/A;  1:00pm  . EP IMPLANTABLE DEVICE N/A 02/08/2015   Procedure: Pacemaker Implant;  Surgeon: Deboraha Sprang, MD;  Location: [facility];  Service: Cardiovascular;  Laterality: N/A;  . SIGMOIDOSCOPY  12/24/2006   rectal adenocarcinoma/Normal retroflexed view of the rectum  . VENTRAL HERNIA REPAIR      Current Outpatient Medications  Medication Sig Dispense Refill  . ALPRAZolam (XANAX) 0.5 MG tablet Take 0.5 mg by mouth as needed.    . Ascorbic Acid (VITA-C PO) Take 1 capsule by mouth daily. Pt unsure of dosage    . aspirin EC 81 MG tablet Take 1 tablet (81 mg total) by mouth daily.    . Carboxymethylcellulose Sodium (THERATEARS) 0.25 % SOLN Place 1 drop into both eyes 2 (two) times daily.    Marland Kitchen escitalopram (LEXAPRO) 10 MG tablet Take 10 mg by mouth at bedtime.    Marland Kitchen ezetimibe (ZETIA) 10 MG tablet Take 1 tablet (10 mg total) by mouth at bedtime.    Marland Kitchen ibuprofen (ADVIL,MOTRIN) 200 MG tablet Take 400 mg by mouth every 8 (eight) hours as needed (for pain.).     Marland Kitchen NEXLETOL 180 MG TABS Take 1 tablet by mouth at bedtime.    Marland Kitchen  omeprazole (PRILOSEC) 40 MG capsule Take 40 mg by mouth 2 (two) times daily.    Marland Kitchen OVER THE COUNTER MEDICATION TOTAL RESTORE for the gut 3 capsule daily    . oxybutynin (DITROPAN-XL) 10 MG 24 hr tablet Take 10 mg by mouth daily.     . psyllium (METAMUCIL) 58.6 % powder Take 1 packet by mouth daily.    . traZODone (DESYREL) 100 MG tablet Take 100 mg by mouth at bedtime.     Marland Kitchen VITAMIN D PO Take 5,000 Units by mouth daily.     No current facility-administered medications for this visit.    Allergies  Allergen Reactions  . Compazine Other (See Comments)     Reaction: seizures  . Hydromorphone Hcl Other (See Comments)    REACTION: hallucinations and behavior issues  . Suprep [Na Sulfate-K Sulfate-Mg Sulf]     Severe bloating and pain after first dose.       Review of Systems negative except from HPI and PMH  Physical Exam BP 124/70   Pulse 73   Ht 5\' 4"  (1.626 m)   Wt 158 lb 9.6 oz (71.9 kg)   SpO2 98%   BMI 27.22 kg/m  Well developed and nourished in no acute distress HENT normal Neck supple with JVP-  flat   Clear Device pocket well healed; without hematoma or erythema.  There is no tethering Regular rate and rhythm, no murmurs or gallops Abd-soft with active BS No Clubbing cyanosis edema Skin-warm and dry A & Oriented  Grossly normal sensory and motor function  ECG Apace @ 73 14/12/41   Assessment and  Plan  Sinus node dysfunction  Pacemaker-Biotronik The patient's device was interrogated.  The information was reviewed. No changes were made in the programming.    Stress/ depression   Chest Pain with stress    no interval syncope  Stress improved

## 2019-10-20 ENCOUNTER — Ambulatory Visit: Payer: 59 | Admitting: Gastroenterology

## 2019-10-20 ENCOUNTER — Other Ambulatory Visit: Payer: Self-pay | Admitting: *Deleted

## 2019-10-20 ENCOUNTER — Encounter: Payer: Self-pay | Admitting: Gastroenterology

## 2019-10-20 VITALS — BP 158/79 | HR 66 | Temp 97.3°F | Ht 64.0 in | Wt 157.4 lb

## 2019-10-20 DIAGNOSIS — R1013 Epigastric pain: Secondary | ICD-10-CM | POA: Insufficient documentation

## 2019-10-20 DIAGNOSIS — K219 Gastro-esophageal reflux disease without esophagitis: Secondary | ICD-10-CM

## 2019-10-20 DIAGNOSIS — R1310 Dysphagia, unspecified: Secondary | ICD-10-CM | POA: Insufficient documentation

## 2019-10-20 DIAGNOSIS — R933 Abnormal findings on diagnostic imaging of other parts of digestive tract: Secondary | ICD-10-CM | POA: Diagnosis not present

## 2019-10-20 NOTE — Patient Instructions (Addendum)
We will get a referral placed for you to have a modified barium swallow with speech to evaluate your trouble swallowing and rule out any sort of aspiration.  Following your evaluation with speech therapy, we will plan to schedule you for an upper endoscopy to evaluate your upper abdominal pain as well as trouble swallowing if speech therapy does not find anything significant.  Please continue omeprazole 40 mg twice daily 30 minutes before breakfast and dinner.  Continue to avoid all NSAID products including ibuprofen, Aleve, Advil, and Goody powders.  Follow-up date to be determined.  We will likely follow-up with you after your upper endoscopy.  Do not hesitate to call with any questions or concerns/worsening symptoms.  Aliene Altes, PA-C [facility]   Food Choices for Gastroesophageal Reflux Disease, Adult When you have gastroesophageal reflux disease (GERD), the foods you eat and your eating habits are very important. Choosing the right foods can help ease your discomfort. Think about working with a nutrition specialist (dietitian) to help you make good choices. What are tips for following this plan?  Meals  Choose healthy foods that are low in fat, such as fruits, vegetables, whole grains, low-fat dairy products, and lean meat, fish, and poultry.  Eat small meals often instead of 3 large meals a day. Eat your meals slowly, and in a place where you are relaxed. Avoid bending over or lying down until 2-3 hours after eating.  Avoid eating meals 2-3 hours before bed.  Avoid drinking a lot of liquid with meals.  Cook foods using methods other than frying. Bake, grill, or broil food instead.  Avoid or limit: ? Chocolate. ? Peppermint or spearmint. ? Alcohol. ? Pepper. ? Black and decaffeinated coffee. ? Black and decaffeinated tea. ? Bubbly (carbonated) soft drinks. ? Caffeinated energy drinks and soft drinks.  Limit high-fat foods such as: ? Fatty meat or  fried foods. ? Whole milk, cream, butter, or ice cream. ? Nuts and nut butters. ? Pastries, donuts, and sweets made with butter or shortening.  Avoid foods that cause symptoms. These foods may be different for everyone. Common foods that cause symptoms include: ? Tomatoes. ? Oranges, lemons, and limes. ? Peppers. ? Spicy food. ? Onions and garlic. ? Vinegar. Lifestyle  Maintain a healthy weight. Ask your doctor what weight is healthy for you. If you need to lose weight, work with your doctor to do so safely.  Exercise for at least 30 minutes for 5 or more days each week, or as told by your doctor.  Wear loose-fitting clothes.  Do not smoke. If you need help quitting, ask your doctor.  Sleep with the head of your bed higher than your feet. Use a wedge under the mattress or blocks under the bed frame to raise the head of the bed. Summary  When you have gastroesophageal reflux disease (GERD), food and lifestyle choices are very important in easing your symptoms.  Eat small meals often instead of 3 large meals a day. Eat your meals slowly, and in a place where you are relaxed.  Limit high-fat foods such as fatty meat or fried foods.  Avoid bending over or lying down until 2-3 hours after eating.  Avoid peppermint and spearmint, caffeine, alcohol, and chocolate. This information is not intended to replace advice given to you by your health care provider. Make sure you discuss any questions you have with your health care provider. Document Revised: 09/10/2018 Document Reviewed: 06/25/2016 Elsevier Patient Education  [place].

## 2019-10-21 DIAGNOSIS — R933 Abnormal findings on diagnostic imaging of other parts of digestive tract: Secondary | ICD-10-CM | POA: Insufficient documentation

## 2019-10-21 NOTE — Assessment & Plan Note (Signed)
Patient reports daily symptoms of feeling foods and liquids going down the wrong way causing her to have a lot of coughing.  States she gets strangled easily.  Present x6 months.  Denies foods getting hung in her esophagus.   We will start by sending her to [facility] for MBSS for further evaluation and to rule out aspiration.  Of note, she is going to need an EGD in the near future to assess new onset epigastric pain with associated nausea as discussed below.

## 2019-10-21 NOTE — Assessment & Plan Note (Addendum)
64 year old female with history of  rectal adenocarcinoma stage III s/p resection/chemo in 2008, colonoscopy in July 2019 with external hemorrhoids, otherwise normal exam with recommendations to repeat in 5 years with pediatric colonoscope. CT angiography in August 2020 showed mild perirectal soft tissue thickening and adjacent apparent sigmoid colonic wall thickening.  States this could be due to postsurgical/post radiation changes.  She is currently without any significant lower GI symptoms.  No BRBPR, melena, or unintentional weight loss.  I discussed CT findings with the patient today and offered early interval colonoscopy to evaluate this further considering her history.  Patient declined colonoscopy.

## 2019-10-21 NOTE — Assessment & Plan Note (Addendum)
64 year old female with a few month history of intermittent epigastric pain radiating to her back with associated nausea without vomiting.  Symptoms improve with eating.  No identified triggers.  Chronic history of intermittent GERD.  Notably GERD have also become more frequent over the last several months.  No NSAIDs.  No BRBPR or melena.  Recent labs in April 2012 hemoglobin 12.8. She was started on omeprazole 40 mg twice daily by her PCP in late April.  She has had some improvement in symptoms since then but abdominal pain continues to be intermittent.  GERD symptoms are improved.  Of note, she also reports 76-month history of foods going down the wrong way causing a lot of coughing essentially occurring with all meals.  No classic esophageal dysphagia symptoms.  Differentials include gastritis, duodenitis, PUD, H. pylori.  Symptoms are not consistent with pancreatitis as they are intermittent and improved with meals.  Also not consistent with biliary colic.  Plan: She is going to need an EGD in the near future to evaluate new onset of epigastric pain.  Due to concerns of possible aspiration, I am sending her to [facility] for MBSS first and will likely schedule EGD thereafter. She is advised to continue omeprazole 40 mg twice daily 30 minutes before breakfast and dinner. Counseled on GERD diet/lifestyle.  Handout provided. Advised to continue to avoid all NSAIDs. We will likely follow-up after EGD.  Advised to call with questions or concerns prior.  *Chronic history of mild thrombocytopenia with platelets 113 in April 2021.

## 2019-10-21 NOTE — Assessment & Plan Note (Signed)
Patient reports chronic history of intermittent GERD.  Used to take Prilosec OTC as needed.  Recently started on omeprazole 40 mg twice daily on 4/28 by PCP due to epigastric pain with associated nausea without vomiting as discussed below.  GERD symptoms are fairly well controlled at this point.  Abdominal pain also improving but does still occur intermittently.  Also reporting dysphagia but symptoms are not clearly esophageal and query possible aspiration.   Plan: Continue omeprazole 40 mg twice daily 30 minutes before breakfast and dinner. Discussed GERD diet/lifestyle.  Handout provided. Referring to [facility] for MBSS to rule out aspiration.  She is going to need an EGD for new onset epigastric pain over age 18 as discussed below.  We will hopefully schedule this after her MBSS and follow-up after EGD.

## 2020-02-11 ENCOUNTER — Other Ambulatory Visit (HOSPITAL_COMMUNITY): Payer: Self-pay | Admitting: Internal Medicine

## 2020-02-11 ENCOUNTER — Encounter (HOSPITAL_COMMUNITY): Payer: Self-pay

## 2020-02-11 ENCOUNTER — Ambulatory Visit (HOSPITAL_COMMUNITY): Admission: RE | Admit: 2020-02-11 | Payer: 59 | Source: Ambulatory Visit

## 2020-02-11 ENCOUNTER — Other Ambulatory Visit: Payer: Self-pay | Admitting: Internal Medicine

## 2020-02-11 DIAGNOSIS — R1011 Right upper quadrant pain: Secondary | ICD-10-CM

## 2020-07-22 IMAGING — CT CT ANGIO CHEST-ABD-PELV FOR DISSECTION W/ AND WO/W CM
2 of 7 series · 12 of 46 positions shown, 14 images · IV contrast (Isovue)
Comparison: None.

CLINICAL DATA: Chest pain, question of aortic dissection mid upper
back pain for 1 and half week Radian thick chest

EXAM:
CT ANGIOGRAPHY CHEST, ABDOMEN AND PELVIS
TECHNIQUE: Multidetector CT imaging through the chest, abdomen and pelvis was
performed using the standard protocol during bolus administration of
intravenous contrast. Multiplanar reconstructed images and MIPs were
obtained and reviewed to evaluate the vascular anatomy.
CONTRAST:  100mL OMNIPAQUE IOHEXOL 350 MG/ML SOLN

[Series 5: dissection axial arterial · axial · arterial · 0.69mm/px · z∈[-417,+123]mm · 9 of 212 slices shown, 11 images]
[im 16/212  soft-tissue]
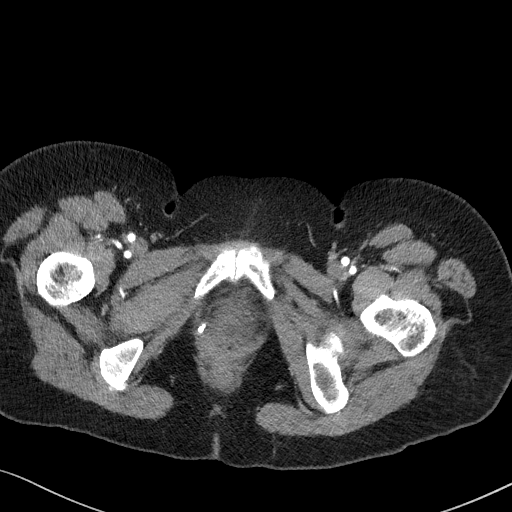
[im 16/212  bone]
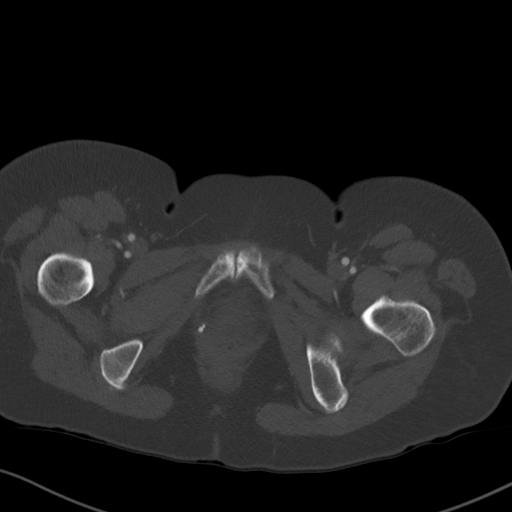
[im 46/212  soft-tissue]
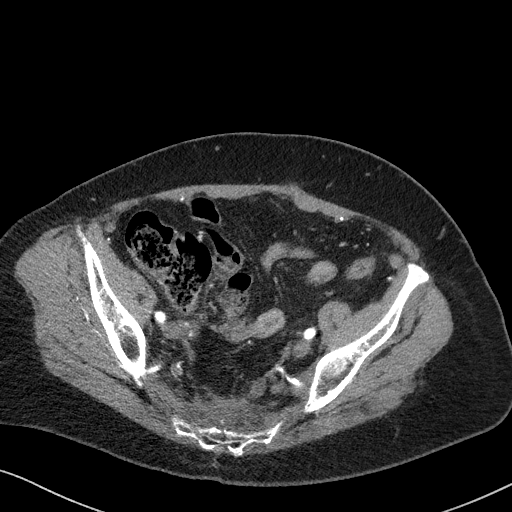
[im 61/212  soft-tissue]
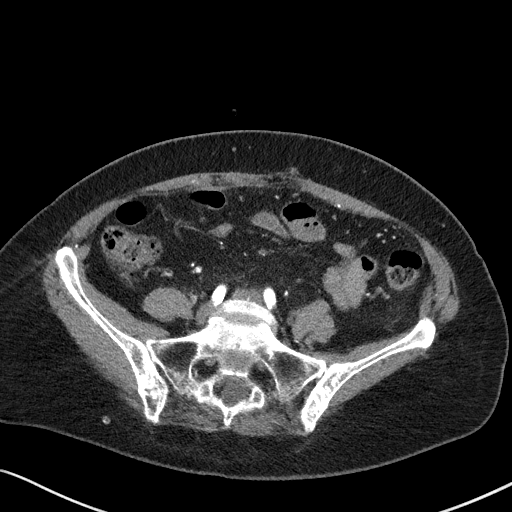
[im 91/212  soft-tissue]
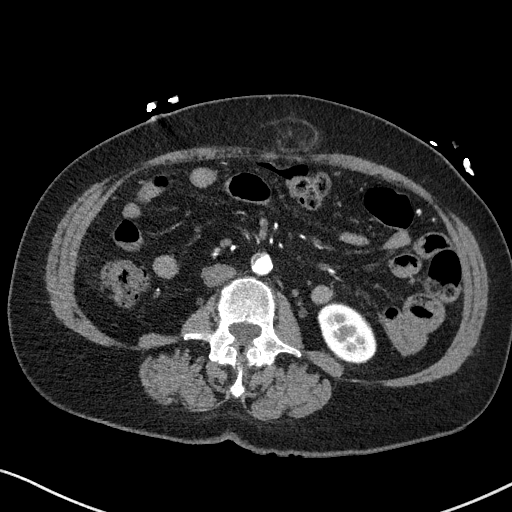
[im 106/212  soft-tissue]
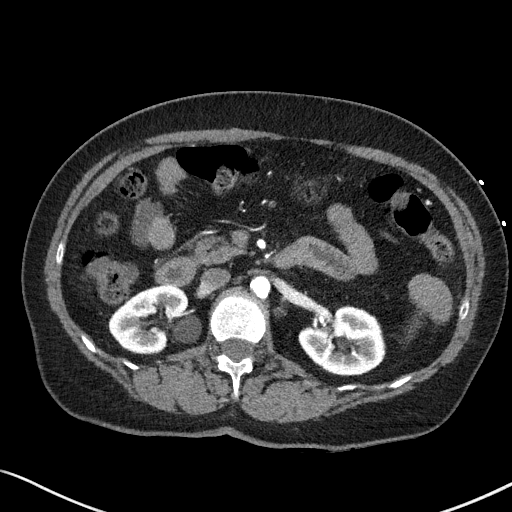
[im 121/212  soft-tissue]
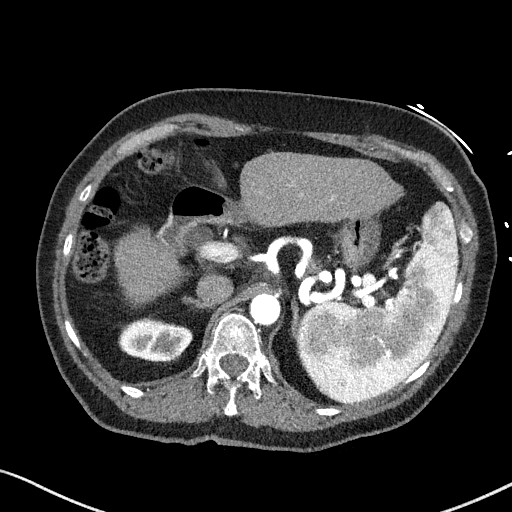
[im 151/212  soft-tissue]
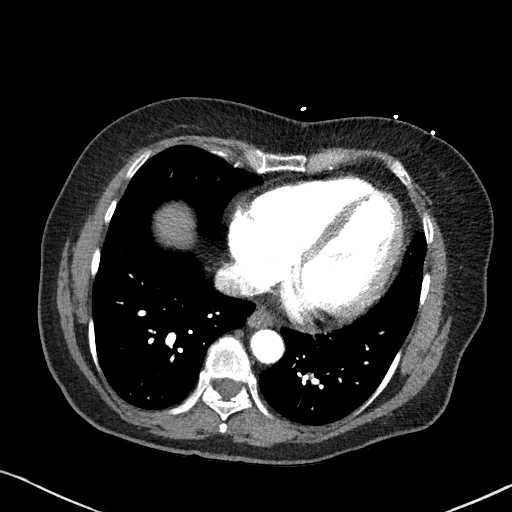
[im 166/212  soft-tissue]
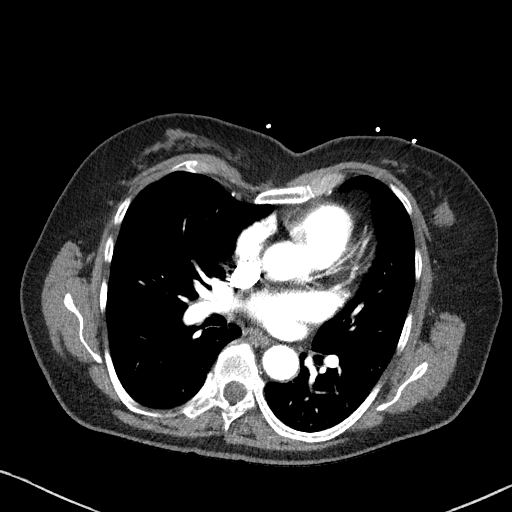
[im 196/212  soft-tissue]
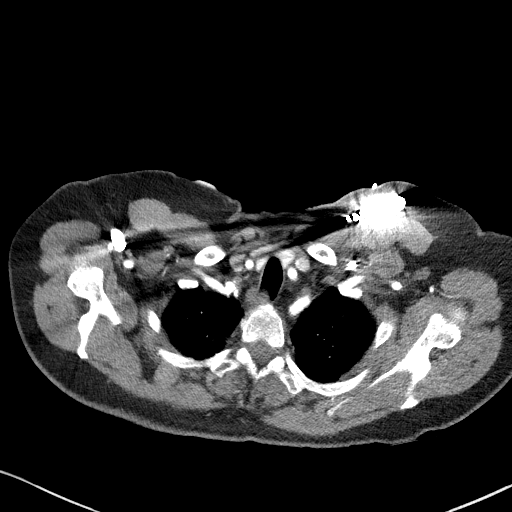
[im 196/212  bone]
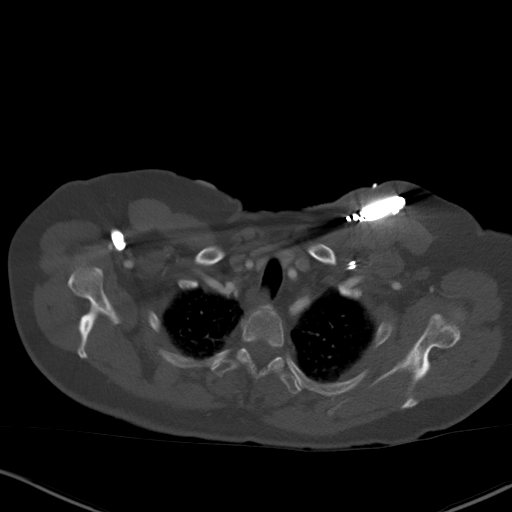

[Series 9: coronals · coronal · 0.65mm/px · 3 of 157 slices shown]
[im 40/157  soft-tissue]
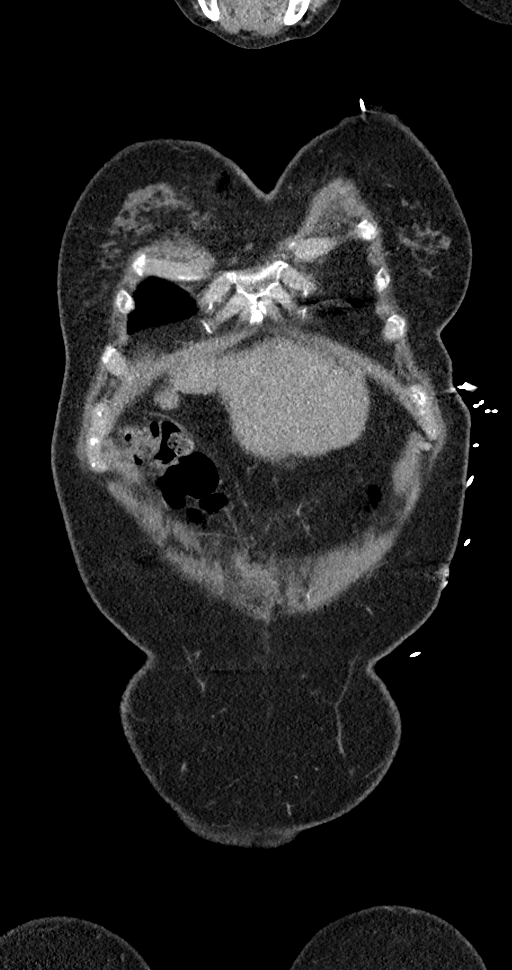
[im 79/157  soft-tissue]
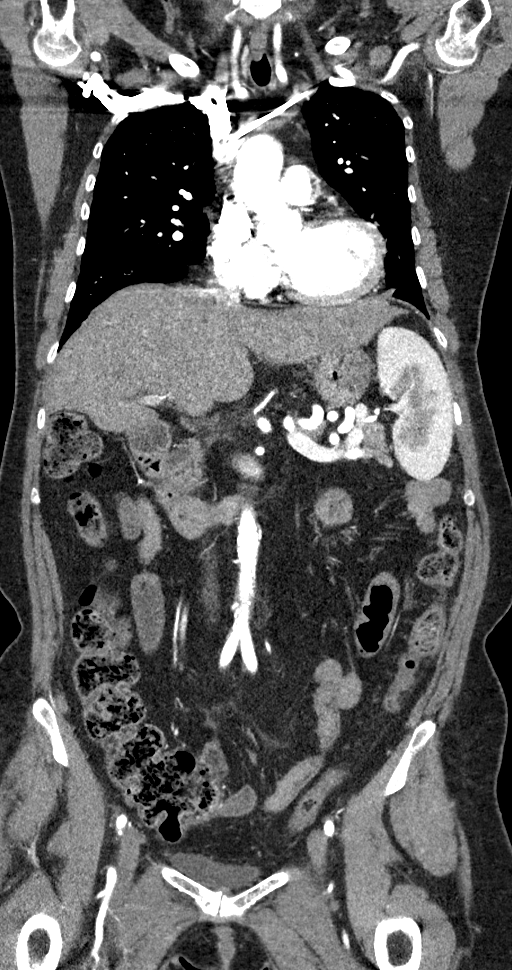
[im 118/157  soft-tissue]
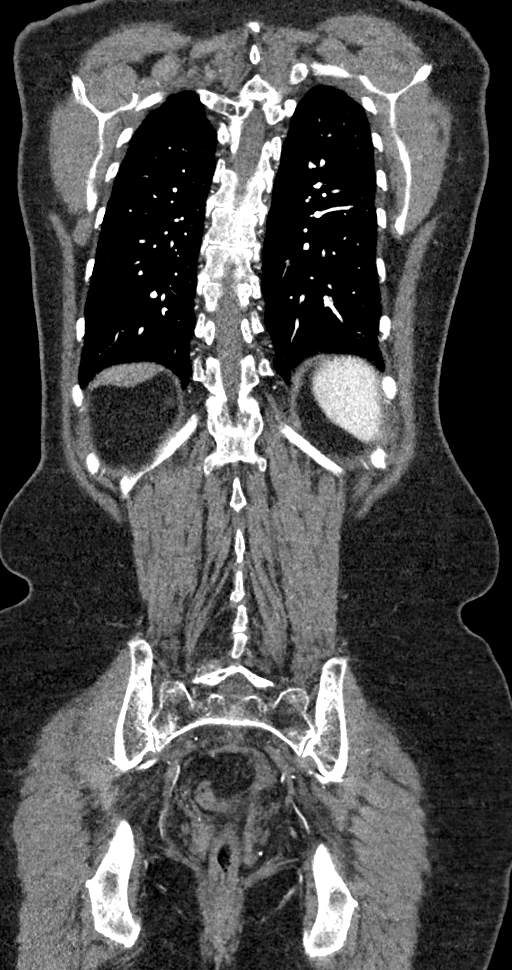

[12 of 46 positions shown; findings below may reference images not displayed]

FINDINGS: CTA CHEST FINDINGS

Cardiovascular:

--Heart: The heart size is mildly enlarged. There is nopericardial
effusion.

--Aorta: The course and caliber of the thoracic aorta are normal.
There is mild atherosclerotic calcifications seen at the aortic arch
and descending intrathoracic aorta. Precontrast images show no
aortic intramural hematoma. There is no blood pool, dissection or
penetrating ulcer demonstrated on arterial phase postcontrast
imaging. There is a conventional 3 vessel aortic arch branching
pattern. There is a mild amount of calcification seen at the origin
of the great vessels.

--Pulmonary Arteries: Contrast timing is optimized for preferential
opacification of the aorta. Within that limitation, normal central
pulmonary arteries.

Mediastinum/Nodes: No mediastinal, hilar or axillary
lymphadenopathy. The visualized thyroid and thoracic esophageal
course are unremarkable.

Lungs/Pleura: No pulmonary nodules or masses. No pleural effusion or
pneumothorax. No focal airspace consolidation. No focal pleural
abnormality.

Musculoskeletal: A left-sided pacemaker is seen with lead tips in
the right atrium and right ventricle. No chest wall abnormality. No
acute osseous findings.

Review of the MIP images confirms the above findings.

CTA ABDOMEN AND PELVIS FINDINGS

VASCULAR

Aorta: Normal caliber aorta without aneurysm, dissection, vasculitis
or hemodynamically significant stenosis. There is calcified
atherosclerosis and noncalcified thrombus seen within the
intra-abdominal aorta predominantly within the infrarenal portion.
The maximum diameter of the infrarenal portion of the abdominal
aorta is 1.7 cm.

Celiac: No aneurysm, dissection or hemodynamically significant
stenosis. Normal branching pattern.

SMA: Widely patent without dissection or stenosis.

Renals: Single renal arteries bilaterally. Atherosclerosis seen at
the origins of the single bilateral renal arteries without
significant stenosis. No aneurysm, dissection, stenosis or evidence
of fibromuscular dysplasia.

IMA: Patent without abnormality.

Inflow: No aneurysm, stenosis or dissection.

Review of the MIP images confirms the above findings.

NON-VASCULAR

Hepatobiliary: Normal hepatic contours and density. No visible
biliary dilatation. The patient is status post cholecystectomy. No
biliary ductal dilation.

Pancreas: Normal contours without ductal dilatation. No
peripancreatic fluid collection.

Spleen: Normal arterial phase splenic enhancement pattern.

Adrenals/Urinary Tract:

--Adrenal glands: Normal.

--Right kidney/ureter: There is a tube cm low-density lesion seen
within the upper pole of the right kidney. No hydronephrosis is
seen.

--Left kidney/ureter: No hydronephrosis or perinephric stranding. No
nephrolithiasis. No obstructing ureteral stones.

--Urinary bladder: Unremarkable.

Stomach/Bowel:

--Stomach/Duodenum: No hiatal hernia or other gastric abnormality.
Normal duodenal course and caliber.

--Small bowel: No dilatation or inflammation.

--Colon: There is surgical anastomosis seen at the sigmoid rectal
junction. Surrounding mild perirectal thickening is noted, likely
postsurgical changes. There is a decompressed sigmoid colon with
apparent bowel wall thickening.

--Appendix: Not seen.

Lymphatic:  No abdominal or pelvic lymphadenopathy.

Reproductive: No free fluid in the pelvis. The patient is status
post hysterectomy.

Musculoskeletal. No bony spinal canal stenosis or focal osseous
abnormality.

Other: Abdominal wall mesh hernia is seen. There is a upper small
anterior abdominal wall fat containing hernia measuring 3.9 cm in
length.

Review of the MIP images confirms the above findings.
IMPRESSION: 1. No acute intrathoracic aortic abnormality. Mild amount of
atherosclerotic plaque.
2. No central or segmental pulmonary embolism.
3. Calcified atherosclerosis and noncalcified thrombus seen within
the infrarenal abdominal aorta without evidence of aneurysmal
dilatation or other acute abnormality. Patent celiac, SMA, and IMA.
4. Status post partial colectomy with the anastomosis seen in the
rectum. Mild perirectal soft tissue thickening within adjacent
apparent sigmoid colonic wall thickening. This could be due to
postsurgical/post radiation changes. If further evaluation is
required would recommend MRI pelvis or correlation with prior exam.
5. Anterior abdominal wall fat containing hernia.

## 2020-08-21 ENCOUNTER — Ambulatory Visit (INDEPENDENT_AMBULATORY_CARE_PROVIDER_SITE_OTHER): Payer: 59

## 2020-08-21 DIAGNOSIS — I495 Sick sinus syndrome: Secondary | ICD-10-CM

## 2020-08-21 LAB — CUP PACEART REMOTE DEVICE CHECK
Date Time Interrogation Session: 20220321080348
Implantable Lead Implant Date: 20160907
Implantable Lead Implant Date: 20160907
Implantable Lead Location: 753859
Implantable Lead Location: 753860
Implantable Lead Model: 5076
Implantable Lead Model: 5076
Implantable Pulse Generator Implant Date: 20160907
Pulse Gen Model: 394929
Pulse Gen Serial Number: 68620792

## 2020-08-29 NOTE — Progress Notes (Signed)
Remote pacemaker transmission.   

## 2020-11-20 ENCOUNTER — Ambulatory Visit (INDEPENDENT_AMBULATORY_CARE_PROVIDER_SITE_OTHER): Payer: 59

## 2020-11-20 DIAGNOSIS — I495 Sick sinus syndrome: Secondary | ICD-10-CM

## 2020-11-20 LAB — CUP PACEART REMOTE DEVICE CHECK
Date Time Interrogation Session: 20220620091654
Implantable Lead Implant Date: 20160907
Implantable Lead Implant Date: 20160907
Implantable Lead Location: 753859
Implantable Lead Location: 753860
Implantable Lead Model: 5076
Implantable Lead Model: 5076
Implantable Pulse Generator Implant Date: 20160907
Pulse Gen Model: 394929
Pulse Gen Serial Number: 68620792

## 2020-12-11 NOTE — Progress Notes (Signed)
Remote pacemaker transmission.   

## 2021-02-19 ENCOUNTER — Ambulatory Visit (INDEPENDENT_AMBULATORY_CARE_PROVIDER_SITE_OTHER): Payer: 59

## 2021-02-19 DIAGNOSIS — I495 Sick sinus syndrome: Secondary | ICD-10-CM

## 2021-02-20 LAB — CUP PACEART REMOTE DEVICE CHECK
Date Time Interrogation Session: 20220919111304
Implantable Lead Implant Date: 20160907
Implantable Lead Implant Date: 20160907
Implantable Lead Location: 753859
Implantable Lead Location: 753860
Implantable Lead Model: 5076
Implantable Lead Model: 5076
Implantable Pulse Generator Implant Date: 20160907
Pulse Gen Model: 394929
Pulse Gen Serial Number: 68620792

## 2021-02-26 NOTE — Progress Notes (Signed)
Remote pacemaker transmission.   

## 2021-05-14 ENCOUNTER — Telehealth: Payer: Self-pay | Admitting: Internal Medicine

## 2021-05-14 NOTE — Telephone Encounter (Signed)
LMOVM for patient to call [employer] tech support to get a new monitor.

## 2021-05-14 NOTE — Telephone Encounter (Signed)
1. Has your device fired? no  2. Is you device beeping? no  3. Are you experiencing draining or swelling at device site? no  4. Are you calling to see if we received your device transmission? no  5. Have you passed out? no  Patient states her monitor is not working and she needs a new one. Phone: [phone]  Please route to [place]

## 2021-05-21 ENCOUNTER — Ambulatory Visit (INDEPENDENT_AMBULATORY_CARE_PROVIDER_SITE_OTHER): Payer: 59

## 2021-05-21 DIAGNOSIS — I495 Sick sinus syndrome: Secondary | ICD-10-CM

## 2021-05-21 LAB — CUP PACEART REMOTE DEVICE CHECK
Date Time Interrogation Session: 20221219150239
Implantable Lead Implant Date: 20160907
Implantable Lead Implant Date: 20160907
Implantable Lead Location: 753859
Implantable Lead Location: 753860
Implantable Lead Model: 5076
Implantable Lead Model: 5076
Implantable Pulse Generator Implant Date: 20160907
Pulse Gen Model: 394929
Pulse Gen Serial Number: 68620792

## 2021-05-31 NOTE — Progress Notes (Signed)
Remote pacemaker transmission.   

## 2021-08-20 ENCOUNTER — Ambulatory Visit (INDEPENDENT_AMBULATORY_CARE_PROVIDER_SITE_OTHER): Payer: 59

## 2021-08-20 DIAGNOSIS — I495 Sick sinus syndrome: Secondary | ICD-10-CM

## 2021-08-21 LAB — CUP PACEART REMOTE DEVICE CHECK
Date Time Interrogation Session: 20230320105736
Implantable Lead Implant Date: 20160907
Implantable Lead Implant Date: 20160907
Implantable Lead Location: 753859
Implantable Lead Location: 753860
Implantable Lead Model: 5076
Implantable Lead Model: 5076
Implantable Pulse Generator Implant Date: 20160907
Pulse Gen Model: 394929
Pulse Gen Serial Number: 68620792

## 2021-08-30 NOTE — Progress Notes (Signed)
Remote pacemaker transmission.   

## 2021-11-19 ENCOUNTER — Ambulatory Visit (INDEPENDENT_AMBULATORY_CARE_PROVIDER_SITE_OTHER): Payer: 59

## 2021-11-19 DIAGNOSIS — I495 Sick sinus syndrome: Secondary | ICD-10-CM

## 2021-11-21 LAB — CUP PACEART REMOTE DEVICE CHECK
Date Time Interrogation Session: 20230620094732
Implantable Lead Implant Date: 20160907
Implantable Lead Implant Date: 20160907
Implantable Lead Location: 753859
Implantable Lead Location: 753860
Implantable Lead Model: 5076
Implantable Lead Model: 5076
Implantable Pulse Generator Implant Date: 20160907
Pulse Gen Model: 394929
Pulse Gen Serial Number: 68620792

## 2021-12-11 NOTE — Progress Notes (Signed)
Remote pacemaker transmission.   

## 2022-02-18 ENCOUNTER — Ambulatory Visit (INDEPENDENT_AMBULATORY_CARE_PROVIDER_SITE_OTHER): Payer: 59

## 2022-02-18 DIAGNOSIS — I495 Sick sinus syndrome: Secondary | ICD-10-CM

## 2022-02-21 LAB — CUP PACEART REMOTE DEVICE CHECK
Date Time Interrogation Session: 20230918135308
Implantable Lead Implant Date: 20160907
Implantable Lead Implant Date: 20160907
Implantable Lead Location: 753859
Implantable Lead Location: 753860
Implantable Lead Model: 5076
Implantable Lead Model: 5076
Implantable Pulse Generator Implant Date: 20160907
Pulse Gen Model: 394929
Pulse Gen Serial Number: 68620792

## 2022-03-05 NOTE — Progress Notes (Signed)
Remote pacemaker transmission.   

## 2022-05-20 ENCOUNTER — Ambulatory Visit (INDEPENDENT_AMBULATORY_CARE_PROVIDER_SITE_OTHER): Payer: 59

## 2022-05-20 DIAGNOSIS — I495 Sick sinus syndrome: Secondary | ICD-10-CM

## 2022-05-21 LAB — CUP PACEART REMOTE DEVICE CHECK
Date Time Interrogation Session: 20231219073723
Implantable Lead Connection Status: 753985
Implantable Lead Connection Status: 753985
Implantable Lead Implant Date: 20160907
Implantable Lead Implant Date: 20160907
Implantable Lead Location: 753859
Implantable Lead Location: 753860
Implantable Lead Model: 5076
Implantable Lead Model: 5076
Implantable Pulse Generator Implant Date: 20160907
Pulse Gen Model: 394929
Pulse Gen Serial Number: 68620792

## 2022-06-26 NOTE — Progress Notes (Signed)
Remote pacemaker transmission.   

## 2022-08-19 ENCOUNTER — Ambulatory Visit (INDEPENDENT_AMBULATORY_CARE_PROVIDER_SITE_OTHER): Payer: Medicare Other

## 2022-08-19 DIAGNOSIS — I495 Sick sinus syndrome: Secondary | ICD-10-CM

## 2022-08-21 LAB — CUP PACEART REMOTE DEVICE CHECK
Battery Voltage: 50
Date Time Interrogation Session: 20240318081322
Implantable Lead Connection Status: 753985
Implantable Lead Connection Status: 753985
Implantable Lead Implant Date: 20160907
Implantable Lead Implant Date: 20160907
Implantable Lead Location: 753859
Implantable Lead Location: 753860
Implantable Lead Model: 5076
Implantable Lead Model: 5076
Implantable Pulse Generator Implant Date: 20160907
Pulse Gen Model: 394929
Pulse Gen Serial Number: 68620792

## 2022-09-30 NOTE — Progress Notes (Signed)
Remote pacemaker transmission.   

## 2022-11-18 ENCOUNTER — Ambulatory Visit (INDEPENDENT_AMBULATORY_CARE_PROVIDER_SITE_OTHER): Payer: Medicare Other

## 2022-11-18 DIAGNOSIS — I495 Sick sinus syndrome: Secondary | ICD-10-CM

## 2022-11-29 LAB — CUP PACEART REMOTE DEVICE CHECK
Date Time Interrogation Session: 20240628114757
Implantable Lead Connection Status: 753985
Implantable Lead Connection Status: 753985
Implantable Lead Implant Date: 20160907
Implantable Lead Implant Date: 20160907
Implantable Lead Location: 753859
Implantable Lead Location: 753860
Implantable Lead Model: 5076
Implantable Lead Model: 5076
Implantable Pulse Generator Implant Date: 20160907
Pulse Gen Model: 394929
Pulse Gen Serial Number: 68620792

## 2022-12-09 NOTE — Progress Notes (Signed)
Remote pacemaker transmission.   

## 2022-12-16 ENCOUNTER — Encounter: Payer: Self-pay | Admitting: *Deleted

## 2022-12-31 ENCOUNTER — Ambulatory Visit: Payer: Medicare Other | Attending: Internal Medicine | Admitting: Internal Medicine

## 2022-12-31 ENCOUNTER — Encounter: Payer: Self-pay | Admitting: Internal Medicine

## 2022-12-31 VITALS — BP 126/84 | HR 78 | Ht 64.0 in | Wt 157.6 lb

## 2022-12-31 DIAGNOSIS — I495 Sick sinus syndrome: Secondary | ICD-10-CM

## 2022-12-31 NOTE — Patient Instructions (Signed)

## 2022-12-31 NOTE — Progress Notes (Signed)
Agree Plavix and what     Patient Care Team: Benita Stabile, MD as PCP - General (Internal Medicine) Chilton Si, MD as PCP - Cardiology (Cardiology) West Bali, MD (Inactive) (Gastroenterology)   HPI  Robin Wolfe is a 67 y.o. female Seen in follow-up for symptomatic bradycardia  perhaps with a component of autonomic insufficiency. She mild appetite underwent pacing with a [employer] device 9/16.   The patient denies chest pain,  nocturnal dyspnea, orthopnea or peripheral edema.  There have been no palpitations, lightheadedness or syncope.   Exercise fatigue.  Not really shortness of breath   The strong family history.  Hypercholesterolemia risk factors.  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, T2N1M0, s/p resection/chemo   S/P placement of cardiac pacemaker, 02/08/15, [employer], MIR compatibile  02/09/2015   Symptomatic sinus bradycardia 02/09/2015   Thrombocytopenia (HCC)    Ventral hernia     Past Surgical History:  Procedure Laterality Date   ABDOMINAL HYSTERECTOMY     endometriosis   CARDIAC CATHETERIZATION N/A 01/16/2015   Procedure: Left Heart Cath and Coronary Angiography;  Surgeon: Kathleene Hazel, MD;  Location: [facility] INVASIVE CV LAB;  Service: Cardiovascular;  Laterality: N/A;   CEREBRAL ANEURYSM REPAIR     CHOLECYSTECTOMY     ileostomyw/ takedown 8/09 Dr Flonnie Hailstone   COLON SURGERY     COLONOSCOPY  11/13/2007   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, but on withdrawal of the scope, the anastomosis seemed to dilate  following the procedure/ Normal visualized colon with stool seen in the proximal colon   COLONOSCOPY  09/12/2008    Small rectal pouch, which made retroflexion challenging/ no polyps detected in the rectum/ No diverticula   COLONOSCOPY  01/21/2012   Procedure:  COLONOSCOPY;  Surgeon: West Bali, MD;  Location: [facility] ENDO SUITE;  Service: Endoscopy;  Laterality: N/A;   COLONOSCOPY N/A 12/25/2017   Procedure: COLONOSCOPY;  Surgeon: West Bali, MD;  Location: [facility] SUITE;  Service: Endoscopy;  Laterality: N/A;  1:00pm   EP IMPLANTABLE DEVICE N/A 02/08/2015   Procedure: Pacemaker Implant;  Surgeon: Duke Salvia, MD;  Location: [facility] INVASIVE CV LAB;  Service: Cardiovascular;  Laterality: N/A;   SIGMOIDOSCOPY  12/24/2006   rectal adenocarcinoma/Normal retroflexed view of the rectum   VENTRAL HERNIA REPAIR      Current Outpatient Medications  Medication Sig Dispense Refill   ibuprofen (ADVIL,MOTRIN) 200 MG tablet Take 400 mg by mouth every 8 (eight) hours as needed (for pain.).      psyllium (METAMUCIL) 58.6 % powder Take 1 packet by mouth daily.     OVER THE COUNTER MEDICATION Sambucus Elderberry with Zinc and Vitamin C 1 capsule by mouth once a day.     No current facility-administered medications for this visit.    Allergies  Allergen Reactions   Compazine Other (See Comments)    Reaction: seizures   Hydromorphone Hcl Other (See Comments)    REACTION: hallucinations and behavior issues   Suprep [Na Sulfate-K Sulfate-Mg Sulf]     Severe bloating and pain after first dose.       Review of Systems negative except from HPI and PMH  Physical Exam BP 126/84   Pulse 78   Ht 5\' 4"  (1.626 m)   Wt 157 lb 9.6 oz (  71.5 kg)   SpO2 99%   BMI 27.05 kg/m  Well developed and well nourished in no acute distress HENT normal Neck supple with JVP-flat Clear Device pocket well healed; without hematoma or erythema.  There is no tethering  Regular rate and rhythm, no  gallop No  murmur Abd-soft with active BS No Clubbing cyanosis  edema Skin-warm and dry A & Oriented  Grossly normal sensory and motor function  ECG atrial pacing at 85 Interval 18/11/37  Device function is normal. Programming changes upper tract rate and sensor rate increased  to 150 CLS threshold change from low to medium-low She was walked in the office and with improvement See Paceart for details.   Assessment and  Plan  Sinus node dysfunction  Pacemaker-[employer]ronik   Stress/ depression       Improved performance status seems with reprogramming her device to make her sinus rate more aggressive although upon return she was actually sensing at 90 bpm.  Will see how she does.

## 2023-02-28 ENCOUNTER — Ambulatory Visit (INDEPENDENT_AMBULATORY_CARE_PROVIDER_SITE_OTHER): Payer: Medicare Other

## 2023-02-28 DIAGNOSIS — I495 Sick sinus syndrome: Secondary | ICD-10-CM

## 2023-02-28 LAB — CUP PACEART REMOTE DEVICE CHECK
Date Time Interrogation Session: 20240927091625
Implantable Lead Connection Status: 753985
Implantable Lead Connection Status: 753985
Implantable Lead Implant Date: 20160907
Implantable Lead Implant Date: 20160907
Implantable Lead Location: 753859
Implantable Lead Location: 753860
Implantable Lead Model: 5076
Implantable Lead Model: 5076
Implantable Pulse Generator Implant Date: 20160907
Pulse Gen Model: 394929
Pulse Gen Serial Number: 68620792

## 2023-03-06 NOTE — Progress Notes (Signed)
Remote pacemaker transmission.   

## 2023-05-30 ENCOUNTER — Ambulatory Visit (INDEPENDENT_AMBULATORY_CARE_PROVIDER_SITE_OTHER): Payer: Medicare Other

## 2023-05-30 DIAGNOSIS — I495 Sick sinus syndrome: Secondary | ICD-10-CM | POA: Diagnosis not present

## 2023-05-31 LAB — CUP PACEART REMOTE DEVICE CHECK
Date Time Interrogation Session: 20241227064915
Implantable Lead Connection Status: 753985
Implantable Lead Connection Status: 753985
Implantable Lead Implant Date: 20160907
Implantable Lead Implant Date: 20160907
Implantable Lead Location: 753859
Implantable Lead Location: 753860
Implantable Lead Model: 5076
Implantable Lead Model: 5076
Implantable Pulse Generator Implant Date: 20160907
Pulse Gen Model: 394929
Pulse Gen Serial Number: 68620792

## 2023-06-06 ENCOUNTER — Telehealth: Payer: Self-pay | Admitting: Internal Medicine

## 2023-06-06 NOTE — Telephone Encounter (Signed)
 Device appt scheduled for 06/18/2023

## 2023-06-06 NOTE — Telephone Encounter (Signed)
 Pt reports heart rates going into 130's-140's while outside walking at a slow pace for several months. No symptoms. Pt states it has been occurring since her last OV which CLS was adjusted she just has not called d/t holidays and etc. Device clinic apt. Made for patient.

## 2023-06-06 NOTE — Telephone Encounter (Signed)
 STAT if HR is under 50 or over 120 (normal HR is 60-100 beats per minute)  What is your heart rate?   Do you have a log of your heart rate readings (document readings)?  P Do you have any other symptoms?   Patient assumes she needs her PPM adjusted because HR has been in the 130's-140's with minimal exertion (just walking outside or around the house). Patient states she does not keep a log of her readings.

## 2023-06-18 ENCOUNTER — Ambulatory Visit: Payer: Medicare Other | Attending: Cardiology

## 2023-06-18 DIAGNOSIS — I495 Sick sinus syndrome: Secondary | ICD-10-CM

## 2023-06-18 LAB — CUP PACEART INCLINIC DEVICE CHECK
Date Time Interrogation Session: 20250115102336
Implantable Lead Connection Status: 753985
Implantable Lead Connection Status: 753985
Implantable Lead Implant Date: 20160907
Implantable Lead Implant Date: 20160907
Implantable Lead Location: 753859
Implantable Lead Location: 753860
Implantable Lead Model: 5076
Implantable Lead Model: 5076
Implantable Pulse Generator Implant Date: 20160907
Pulse Gen Model: 394929
Pulse Gen Serial Number: 68620792

## 2023-06-18 NOTE — Progress Notes (Signed)
 Pt seen in device clinic d/t complaints of elevated heart rates with minimal activity. 1st change made-decreased CLS resting rate control to +20. Hall walk.  Post walk Pt's heart rate was 140 bpm. After discussion with Pt-she was happier with prior programming. Final programming changes- CLS response programmed to LOW.  CLS resting rate control +20.  Pt will contact device clinic if any further issues.

## 2023-06-18 NOTE — Patient Instructions (Signed)
 Follow up as scheduled.

## 2023-07-02 ENCOUNTER — Encounter: Payer: Self-pay | Admitting: Internal Medicine

## 2023-07-03 NOTE — Progress Notes (Signed)
Remote pacemaker transmission.

## 2023-07-03 NOTE — Addendum Note (Signed)
Addended by: Elease Etienne A on: 07/03/2023 10:40 AM   Modules accepted: Orders

## 2023-08-29 ENCOUNTER — Ambulatory Visit (INDEPENDENT_AMBULATORY_CARE_PROVIDER_SITE_OTHER): Payer: Self-pay

## 2023-08-29 DIAGNOSIS — I495 Sick sinus syndrome: Secondary | ICD-10-CM | POA: Diagnosis not present

## 2023-09-01 LAB — CUP PACEART REMOTE DEVICE CHECK
Battery Voltage: 45
Date Time Interrogation Session: 20250328084124
Implantable Lead Connection Status: 753985
Implantable Lead Connection Status: 753985
Implantable Lead Implant Date: 20160907
Implantable Lead Implant Date: 20160907
Implantable Lead Location: 753859
Implantable Lead Location: 753860
Implantable Lead Model: 5076
Implantable Lead Model: 5076
Implantable Pulse Generator Implant Date: 20160907
Pulse Gen Model: 394929
Pulse Gen Serial Number: 68620792

## 2023-09-20 ENCOUNTER — Encounter: Payer: Self-pay | Admitting: Internal Medicine

## 2023-10-07 NOTE — Addendum Note (Signed)
 Addended by: Lott Rouleau A on: 10/07/2023 04:12 PM   Modules accepted: Orders

## 2023-10-07 NOTE — Progress Notes (Signed)
 Remote pacemaker transmission.

## 2023-11-28 ENCOUNTER — Ambulatory Visit (INDEPENDENT_AMBULATORY_CARE_PROVIDER_SITE_OTHER): Payer: Self-pay

## 2023-11-28 DIAGNOSIS — I495 Sick sinus syndrome: Secondary | ICD-10-CM

## 2023-11-28 LAB — CUP PACEART REMOTE DEVICE CHECK
Date Time Interrogation Session: 20250627090319
Implantable Lead Connection Status: 753985
Implantable Lead Connection Status: 753985
Implantable Lead Implant Date: 20160907
Implantable Lead Implant Date: 20160907
Implantable Lead Location: 753859
Implantable Lead Location: 753860
Implantable Lead Model: 5076
Implantable Lead Model: 5076
Implantable Pulse Generator Implant Date: 20160907
Pulse Gen Model: 394929
Pulse Gen Serial Number: 68620792

## 2023-12-19 ENCOUNTER — Encounter: Payer: Self-pay | Admitting: Advanced Practice Midwife

## 2024-02-05 NOTE — Progress Notes (Signed)
 Remote pacemaker transmission.

## 2024-02-27 ENCOUNTER — Ambulatory Visit (INDEPENDENT_AMBULATORY_CARE_PROVIDER_SITE_OTHER): Payer: Self-pay

## 2024-02-27 DIAGNOSIS — I495 Sick sinus syndrome: Secondary | ICD-10-CM

## 2024-02-28 LAB — CUP PACEART REMOTE DEVICE CHECK
Date Time Interrogation Session: 20250926102156
Implantable Lead Connection Status: 753985
Implantable Lead Connection Status: 753985
Implantable Lead Implant Date: 20160907
Implantable Lead Implant Date: 20160907
Implantable Lead Location: 753859
Implantable Lead Location: 753860
Implantable Lead Model: 5076
Implantable Lead Model: 5076
Implantable Pulse Generator Implant Date: 20160907
Pulse Gen Model: 394929
Pulse Gen Serial Number: 68620792

## 2024-03-03 NOTE — Progress Notes (Signed)
 Remote PPM Transmission

## 2024-03-09 ENCOUNTER — Ambulatory Visit: Payer: Self-pay | Admitting: Cardiovascular Disease

## 2024-04-08 ENCOUNTER — Ambulatory Visit: Payer: Self-pay | Admitting: Cardiovascular Disease

## 2024-05-28 ENCOUNTER — Ambulatory Visit: Payer: Self-pay

## 2024-05-28 DIAGNOSIS — I495 Sick sinus syndrome: Secondary | ICD-10-CM | POA: Diagnosis not present

## 2024-05-31 LAB — CUP PACEART REMOTE DEVICE CHECK
Date Time Interrogation Session: 20251226090605
Implantable Lead Connection Status: 753985
Implantable Lead Connection Status: 753985
Implantable Lead Implant Date: 20160907
Implantable Lead Implant Date: 20160907
Implantable Lead Location: 753859
Implantable Lead Location: 753860
Implantable Lead Model: 5076
Implantable Lead Model: 5076
Implantable Pulse Generator Implant Date: 20160907
Pulse Gen Model: 394929
Pulse Gen Serial Number: 68620792

## 2024-06-02 ENCOUNTER — Ambulatory Visit: Payer: Self-pay | Admitting: Cardiovascular Disease

## 2024-06-02 NOTE — Progress Notes (Signed)
 Remote PPM Transmission

## 2024-07-16 ENCOUNTER — Ambulatory Visit: Admitting: Cardiovascular Disease
